# Patient Record
Sex: Female | Born: 1955 | Race: White | Hispanic: No | Marital: Single | Smoking: Current every day smoker
Health system: Southern US, Community
[De-identification: ages and names within clinical notes are randomized; demographics above are authoritative.]

## PROBLEM LIST (undated history)

## (undated) DIAGNOSIS — Z9889 Other specified postprocedural states: Secondary | ICD-10-CM

## (undated) DIAGNOSIS — I499 Cardiac arrhythmia, unspecified: Secondary | ICD-10-CM

## (undated) DIAGNOSIS — R011 Cardiac murmur, unspecified: Secondary | ICD-10-CM

## (undated) DIAGNOSIS — I2510 Atherosclerotic heart disease of native coronary artery without angina pectoris: Secondary | ICD-10-CM

## (undated) DIAGNOSIS — L920 Granuloma annulare: Secondary | ICD-10-CM

## (undated) DIAGNOSIS — J988 Other specified respiratory disorders: Secondary | ICD-10-CM

## (undated) DIAGNOSIS — K635 Polyp of colon: Secondary | ICD-10-CM

## (undated) DIAGNOSIS — I340 Nonrheumatic mitral (valve) insufficiency: Secondary | ICD-10-CM

## (undated) DIAGNOSIS — Z8489 Family history of other specified conditions: Secondary | ICD-10-CM

## (undated) DIAGNOSIS — T8859XA Other complications of anesthesia, initial encounter: Secondary | ICD-10-CM

## (undated) DIAGNOSIS — E079 Disorder of thyroid, unspecified: Secondary | ICD-10-CM

## (undated) DIAGNOSIS — F419 Anxiety disorder, unspecified: Secondary | ICD-10-CM

## (undated) DIAGNOSIS — D649 Anemia, unspecified: Secondary | ICD-10-CM

## (undated) DIAGNOSIS — M797 Fibromyalgia: Secondary | ICD-10-CM

## (undated) DIAGNOSIS — K219 Gastro-esophageal reflux disease without esophagitis: Secondary | ICD-10-CM

## (undated) DIAGNOSIS — Q179 Congenital malformation of ear, unspecified: Secondary | ICD-10-CM

## (undated) DIAGNOSIS — T4145XA Adverse effect of unspecified anesthetic, initial encounter: Secondary | ICD-10-CM

## (undated) DIAGNOSIS — R112 Nausea with vomiting, unspecified: Secondary | ICD-10-CM

## (undated) DIAGNOSIS — R0600 Dyspnea, unspecified: Secondary | ICD-10-CM

## (undated) DIAGNOSIS — I10 Essential (primary) hypertension: Secondary | ICD-10-CM

## (undated) DIAGNOSIS — Z8619 Personal history of other infectious and parasitic diseases: Secondary | ICD-10-CM

## (undated) DIAGNOSIS — Z974 Presence of external hearing-aid: Secondary | ICD-10-CM

## (undated) HISTORY — DX: Granuloma annulare: L92.0

## (undated) HISTORY — PX: ABDOMINAL HYSTERECTOMY: SHX81

## (undated) HISTORY — DX: Cardiac murmur, unspecified: R01.1

## (undated) HISTORY — PX: CAROTID ENDARTERECTOMY: SUR193

## (undated) HISTORY — DX: Presence of external hearing-aid: Z97.4

## (undated) HISTORY — PX: CARPAL TUNNEL RELEASE: SHX101

## (undated) HISTORY — PX: STAPEDES SURGERY: SHX789

## (undated) HISTORY — PX: TRIGGER FINGER RELEASE: SHX641

## (undated) HISTORY — PX: OOPHORECTOMY: SHX86

## (undated) HISTORY — DX: Polyp of colon: K63.5

## (undated) HISTORY — DX: Personal history of other infectious and parasitic diseases: Z86.19

## (undated) HISTORY — DX: Fibromyalgia: M79.7

---
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
## 2009-03-22 ENCOUNTER — Ambulatory Visit: Payer: Self-pay | Admitting: Internal Medicine

## 2010-01-24 ENCOUNTER — Ambulatory Visit: Payer: Self-pay | Admitting: Internal Medicine

## 2010-02-17 ENCOUNTER — Ambulatory Visit: Payer: Self-pay | Admitting: Internal Medicine

## 2010-03-13 ENCOUNTER — Ambulatory Visit: Payer: Self-pay | Admitting: Internal Medicine

## 2010-12-17 ENCOUNTER — Ambulatory Visit: Payer: Self-pay | Admitting: Vascular Surgery

## 2011-01-07 ENCOUNTER — Ambulatory Visit: Payer: Self-pay | Admitting: Internal Medicine

## 2011-01-20 ENCOUNTER — Ambulatory Visit: Payer: Self-pay | Admitting: Internal Medicine

## 2011-02-04 ENCOUNTER — Ambulatory Visit: Payer: Self-pay | Admitting: Vascular Surgery

## 2011-02-11 ENCOUNTER — Inpatient Hospital Stay: Payer: Self-pay | Admitting: Vascular Surgery

## 2011-08-13 ENCOUNTER — Ambulatory Visit: Payer: Self-pay | Admitting: Internal Medicine

## 2011-12-14 ENCOUNTER — Emergency Department: Payer: Self-pay | Admitting: Emergency Medicine

## 2011-12-14 LAB — BASIC METABOLIC PANEL
Anion Gap: 6 — ABNORMAL LOW (ref 7–16)
Calcium, Total: 9.1 mg/dL (ref 8.5–10.1)
Chloride: 103 mmol/L (ref 98–107)
Co2: 30 mmol/L (ref 21–32)
Creatinine: 1.03 mg/dL (ref 0.60–1.30)
EGFR (African American): >60
EGFR (Non-African Amer.): 59 — ABNORMAL LOW
Potassium: 4.5 mmol/L (ref 3.5–5.1)
Sodium: 139 mmol/L (ref 136–145)

## 2011-12-14 LAB — CK TOTAL AND CKMB (NOT AT ARMC)
CK, Total: 168 U/L (ref 21–215)
CK, Total: 142 U/L (ref 21–215)
CK-MB: 3.2 ng/mL (ref 0.5–3.6)
CK-MB: 2.5 ng/mL (ref 0.5–3.6)

## 2011-12-14 LAB — CBC
HGB: 13.9 g/dL (ref 12.0–16.0)
MCH: 31 pg (ref 26.0–34.0)
MCV: 93 fL (ref 80–100)
Platelet: 147 10*3/uL — ABNORMAL LOW (ref 150–440)
RDW: 13.3 % (ref 11.5–14.5)
WBC: 7.1 10*3/uL (ref 3.6–11.0)

## 2012-03-21 ENCOUNTER — Ambulatory Visit: Payer: Self-pay | Admitting: Internal Medicine

## 2012-08-15 ENCOUNTER — Ambulatory Visit: Payer: Self-pay | Admitting: Internal Medicine

## 2012-10-06 ENCOUNTER — Ambulatory Visit: Payer: Self-pay | Admitting: Vascular Surgery

## 2013-08-01 ENCOUNTER — Ambulatory Visit: Payer: Self-pay | Admitting: Internal Medicine

## 2013-08-16 ENCOUNTER — Ambulatory Visit: Payer: Self-pay | Admitting: Physician Assistant

## 2014-08-20 ENCOUNTER — Ambulatory Visit: Payer: Self-pay | Admitting: Physician Assistant

## 2015-09-15 DIAGNOSIS — J988 Other specified respiratory disorders: Secondary | ICD-10-CM

## 2015-09-15 HISTORY — DX: Other specified respiratory disorders: J98.8

## 2015-12-18 ENCOUNTER — Encounter: Payer: Self-pay | Admitting: Emergency Medicine

## 2015-12-18 ENCOUNTER — Emergency Department
Admission: EM | Admit: 2015-12-18 | Discharge: 2015-12-18 | Disposition: A | Discharge status: Home / Self Care | Payer: BLUE CROSS/BLUE SHIELD | Attending: Emergency Medicine | Admitting: Emergency Medicine

## 2015-12-18 DIAGNOSIS — T7840XA Allergy, unspecified, initial encounter: Secondary | ICD-10-CM

## 2015-12-18 DIAGNOSIS — I10 Essential (primary) hypertension: Secondary | ICD-10-CM | POA: Diagnosis not present

## 2015-12-18 DIAGNOSIS — T360X1A Poisoning by penicillins, accidental (unintentional), initial encounter: Secondary | ICD-10-CM | POA: Insufficient documentation

## 2015-12-18 DIAGNOSIS — E079 Disorder of thyroid, unspecified: Secondary | ICD-10-CM | POA: Insufficient documentation

## 2015-12-18 DIAGNOSIS — F17210 Nicotine dependence, cigarettes, uncomplicated: Secondary | ICD-10-CM | POA: Insufficient documentation

## 2015-12-18 DIAGNOSIS — R21 Rash and other nonspecific skin eruption: Secondary | ICD-10-CM | POA: Insufficient documentation

## 2015-12-18 DIAGNOSIS — I2510 Atherosclerotic heart disease of native coronary artery without angina pectoris: Secondary | ICD-10-CM | POA: Diagnosis not present

## 2015-12-18 DIAGNOSIS — Z90710 Acquired absence of both cervix and uterus: Secondary | ICD-10-CM | POA: Diagnosis not present

## 2015-12-18 HISTORY — DX: Essential (primary) hypertension: I10

## 2015-12-18 HISTORY — DX: Gastro-esophageal reflux disease without esophagitis: K21.9

## 2015-12-18 HISTORY — DX: Atherosclerotic heart disease of native coronary artery without angina pectoris: I25.10

## 2015-12-18 HISTORY — DX: Disorder of thyroid, unspecified: E07.9

## 2015-12-18 MED ORDER — RANITIDINE HCL 150 MG PO TABS: 150.0000 mg | ORAL_TABLET | Freq: Two times a day (BID) | ORAL | Status: DC

## 2015-12-18 MED ORDER — HYDROXYZINE PAMOATE 25 MG PO CAPS: 25.0000 mg | ORAL_CAPSULE | Freq: Three times a day (TID) | ORAL | Status: DC | PRN

## 2015-12-18 MED ORDER — PREDNISONE 10 MG PO TABS: 50.0000 mg | ORAL_TABLET | Freq: Every day | ORAL | Status: DC

## 2015-12-18 NOTE — Discharge Instructions (Signed)

## 2015-12-18 NOTE — ED Provider Notes (Signed)
[facility] Emergency Department Provider Note  ____________________________________________  Time seen: Approximately 7:45 AM  I have reviewed the triage vital signs and the nursing notes.   HISTORY  Chief Complaint Rash and Allergic Reaction    HPI Nicole Herman is a 60 y.o. female presents emergency room for evaluation of a rash and a possible allergic reaction. Patient reports started taking amoxicillin approximately one week ago. She describes the rash is diffuse fine red rash to her abdomen and chest hands and arms. In addition having itching. Feels like her throat is swelling. No obvious swelling to the airway. Patient is speaking in complete sentences. His medical history significant for thyroid disease and hypertension. On a new medication recently was amoxicillin.   Past Medical History  Diagnosis Date  . Thyroid disease   . Hypertension   . Coronary artery disease   . GERD (gastroesophageal reflux disease)     There are no active problems to display for this patient.   Past Surgical History  Procedure Laterality Date  . Abdominal hysterectomy    . Trigger finger release    . Carotid endarterectomy      Current Outpatient Rx  Name  Route  Sig  Dispense  Refill  . hydrOXYzine (VISTARIL) 25 MG capsule   Oral   Take 1 capsule (25 mg total) by mouth 3 (three) times daily as needed.   30 capsule   0   . predniSONE (DELTASONE) 10 MG tablet   Oral   Take 5 tablets (50 mg total) by mouth daily with breakfast.   25 tablet   0   . ranitidine (ZANTAC) 150 MG tablet   Oral   Take 1 tablet (150 mg total) by mouth 2 (two) times daily.   14 tablet   1     Allergies Review of patient's allergies indicates no known allergies.  No family history on file.  Social History Social History  Substance Use Topics  . Smoking status: Current Every Day Smoker -- 1.00 packs/day    Types: Cigarettes  . Smokeless tobacco: None  . Alcohol Use:  No    Review of Systems Constitutional: No fever/chills ENT: No sore throat. Denies any difficulty swallowing. Cardiovascular: Denies chest pain. Respiratory: Denies shortness of breath. Denies any difficulty breathing. Gastrointestinal: No abdominal pain.  No nausea, no vomiting.  No diarrhea.  No constipation. Genitourinary: Negative for dysuria. Musculoskeletal: Negative for back pain. Skin: Positive for rash. Neurological: Negative for headaches, focal weakness or numbness.  10-point ROS otherwise negative.  ____________________________________________   PHYSICAL EXAM:  VITAL SIGNS: ED Triage Vitals  Enc Vitals Group     BP 12/18/15 0729 141/96 mmHg     Pulse Rate 12/18/15 0729 86     Resp 12/18/15 0729 18     Temp 12/18/15 0729 97.8 F (36.6 C)     Temp Source 12/18/15 0729 Oral     SpO2 12/18/15 0729 96 %     Weight 12/18/15 0729 139 lb (63.05 kg)     Height 12/18/15 0729 5\' 4"  (1.626 m)     Head Cir --      Peak Flow --      Pain Score --      Pain Loc --      Pain Edu? --      Excl. in [place]? --     Constitutional: Alert and oriented. Well appearing and in no acute distress. Head: Atraumatic. Nose: No congestion/rhinnorhea. Mouth/Throat: Mucous membranes  are moist.  Oropharynx non-erythematous. Neck: No stridor.  Full range of motion Cardiovascular: Normal rate, regular rhythm. Grossly normal heart sounds.  Good peripheral circulation. Respiratory: Normal respiratory effort.  No retractions. Lungs CTAB. Musculoskeletal: No lower extremity tenderness nor edema.  No joint effusions. Neurologic:  Normal speech and language. No gross focal neurologic deficits are appreciated. No gait instability. Skin:  Skin is warm, dry and intact. Fine erythematous rash scattered on the trunk and arms legs and neck. In addition there is some annular lesions noted. These are chronic in nature. Psychiatric: Mood and affect are normal. Speech and behavior are  normal.  ____________________________________________   LABS (all labs ordered are listed, but only abnormal results are displayed)  Labs Reviewed - No data to display ____________________________________________   PROCEDURES  Procedure(s) performed: None  Critical Care performed: No  ____________________________________________   INITIAL IMPRESSION / ASSESSMENT AND PLAN / ED COURSE  Pertinent labs & imaging results that were available during my care of the patient were reviewed by me and considered in my medical decision making (see chart for details).  Acute allergic reaction secondary to amoxicillin intake. Plan is to stop amoxicillin Rx given for Vistaril, Zantac and prednisone. Patient to follow up with PCP or return to the ER with any worsening symptomology. She voices no other emergency medical complaints at this time. ____________________________________________   FINAL CLINICAL IMPRESSION(S) / ED DIAGNOSES  Final diagnoses:  Allergic reaction caused by a drug     This chart was dictated using voice recognition software/Dragon. Despite best efforts to proofread, errors can occur which can change the meaning. Any change was purely unintentional.   Arlyss Repress, PA-C 12/18/15 [phone]

## 2015-12-18 NOTE — ED Notes (Signed)
Pt presents to ED with complaints of rash and possible allergic reaction. Pt states has been taking amoxicillin approximately one week ago. Pt presents with diffuse fine red rash to abdomen, chest, hands and arms. Pt reports itching. Pt states feeling like throat is swelling. No obvious swelling to airway in triage. Pt speaking in complete sentences, NAD noted in triage.

## 2016-04-14 ENCOUNTER — Other Ambulatory Visit: Payer: Self-pay | Admitting: Nurse Practitioner

## 2016-04-14 DIAGNOSIS — G43119 Migraine with aura, intractable, without status migrainosus: Secondary | ICD-10-CM

## 2016-04-14 DIAGNOSIS — I6523 Occlusion and stenosis of bilateral carotid arteries: Secondary | ICD-10-CM

## 2016-04-17 ENCOUNTER — Ambulatory Visit
Admission: RE | Admit: 2016-04-17 | Discharge: 2016-04-17 | Disposition: A | Discharge status: Home / Self Care | Payer: BLUE CROSS/BLUE SHIELD | Source: Ambulatory Visit | Attending: Nurse Practitioner | Admitting: Nurse Practitioner

## 2016-04-17 DIAGNOSIS — I6523 Occlusion and stenosis of bilateral carotid arteries: Secondary | ICD-10-CM | POA: Diagnosis not present

## 2016-04-17 DIAGNOSIS — I7090 Unspecified atherosclerosis: Secondary | ICD-10-CM | POA: Diagnosis not present

## 2016-04-17 DIAGNOSIS — G43119 Migraine with aura, intractable, without status migrainosus: Secondary | ICD-10-CM | POA: Insufficient documentation

## 2016-04-17 DIAGNOSIS — G939 Disorder of brain, unspecified: Secondary | ICD-10-CM | POA: Insufficient documentation

## 2016-06-09 DIAGNOSIS — M5481 Occipital neuralgia: Secondary | ICD-10-CM | POA: Insufficient documentation

## 2016-06-09 DIAGNOSIS — Z720 Tobacco use: Secondary | ICD-10-CM | POA: Insufficient documentation

## 2016-11-20 DIAGNOSIS — L920 Granuloma annulare: Secondary | ICD-10-CM | POA: Insufficient documentation

## 2017-05-13 ENCOUNTER — Encounter (INDEPENDENT_AMBULATORY_CARE_PROVIDER_SITE_OTHER): Payer: 59

## 2017-05-13 ENCOUNTER — Ambulatory Visit (INDEPENDENT_AMBULATORY_CARE_PROVIDER_SITE_OTHER): Payer: Self-pay | Admitting: Vascular Surgery

## 2017-05-21 ENCOUNTER — Other Ambulatory Visit (INDEPENDENT_AMBULATORY_CARE_PROVIDER_SITE_OTHER): Payer: Self-pay | Admitting: Vascular Surgery

## 2017-05-21 DIAGNOSIS — I739 Peripheral vascular disease, unspecified: Principal | ICD-10-CM

## 2017-05-21 DIAGNOSIS — I779 Disorder of arteries and arterioles, unspecified: Secondary | ICD-10-CM

## 2017-05-25 ENCOUNTER — Ambulatory Visit (INDEPENDENT_AMBULATORY_CARE_PROVIDER_SITE_OTHER): Payer: 59

## 2017-05-25 DIAGNOSIS — I739 Peripheral vascular disease, unspecified: Principal | ICD-10-CM

## 2017-05-25 DIAGNOSIS — I779 Disorder of arteries and arterioles, unspecified: Secondary | ICD-10-CM | POA: Diagnosis not present

## 2017-05-25 LAB — VAS US CAROTID
LCCADDIAS: 27 cm/s
LCCAPDIAS: 30 cm/s
LCCAPSYS: 106 cm/s
LEFT ECA DIAS: -21 cm/s
LEFT VERTEBRAL DIAS: 19 cm/s
LICADDIAS: -53 cm/s
LICADSYS: -89 cm/s
LICAPSYS: -77 cm/s
Left CCA dist sys: 89 cm/s
Left ICA prox dias: -29 cm/s
RCCADSYS: -109 cm/s
RCCAPSYS: 97 cm/s
RIGHT CCA MID DIAS: 23 cm/s
RIGHT ECA DIAS: -25 cm/s
RIGHT VERTEBRAL DIAS: 33 cm/s
Right CCA prox dias: 24 cm/s

## 2017-09-01 ENCOUNTER — Other Ambulatory Visit: Payer: Self-pay | Admitting: Nurse Practitioner

## 2017-09-15 DIAGNOSIS — E039 Hypothyroidism, unspecified: Secondary | ICD-10-CM | POA: Insufficient documentation

## 2017-09-15 DIAGNOSIS — R635 Abnormal weight gain: Secondary | ICD-10-CM | POA: Insufficient documentation

## 2017-09-15 DIAGNOSIS — E782 Mixed hyperlipidemia: Secondary | ICD-10-CM

## 2017-09-15 DIAGNOSIS — E785 Hyperlipidemia, unspecified: Secondary | ICD-10-CM | POA: Insufficient documentation

## 2017-09-15 DIAGNOSIS — N390 Urinary tract infection, site not specified: Secondary | ICD-10-CM | POA: Insufficient documentation

## 2017-09-15 DIAGNOSIS — E559 Vitamin D deficiency, unspecified: Secondary | ICD-10-CM | POA: Insufficient documentation

## 2017-09-15 DIAGNOSIS — F411 Generalized anxiety disorder: Secondary | ICD-10-CM | POA: Insufficient documentation

## 2017-09-15 DIAGNOSIS — R0602 Shortness of breath: Secondary | ICD-10-CM | POA: Insufficient documentation

## 2017-09-15 DIAGNOSIS — I10 Essential (primary) hypertension: Secondary | ICD-10-CM | POA: Insufficient documentation

## 2017-09-16 ENCOUNTER — Encounter: Payer: Self-pay | Admitting: Nurse Practitioner

## 2017-09-16 ENCOUNTER — Ambulatory Visit: Payer: No Typology Code available for payment source | Admitting: Nurse Practitioner

## 2017-09-16 VITALS — BP 130/80 | HR 59 | Resp 16 | Ht 64.0 in | Wt 130.0 lb

## 2017-09-16 DIAGNOSIS — E039 Hypothyroidism, unspecified: Secondary | ICD-10-CM | POA: Diagnosis not present

## 2017-09-16 DIAGNOSIS — L920 Granuloma annulare: Secondary | ICD-10-CM

## 2017-09-16 DIAGNOSIS — E559 Vitamin D deficiency, unspecified: Secondary | ICD-10-CM | POA: Diagnosis not present

## 2017-09-16 DIAGNOSIS — Z1231 Encounter for screening mammogram for malignant neoplasm of breast: Secondary | ICD-10-CM | POA: Diagnosis not present

## 2017-09-16 DIAGNOSIS — R944 Abnormal results of kidney function studies: Secondary | ICD-10-CM

## 2017-09-16 DIAGNOSIS — Z1239 Encounter for other screening for malignant neoplasm of breast: Secondary | ICD-10-CM

## 2017-09-16 DIAGNOSIS — I10 Essential (primary) hypertension: Secondary | ICD-10-CM | POA: Diagnosis not present

## 2017-09-16 MED ORDER — ERGOCALCIFEROL 1.25 MG (50000 UT) PO CAPS: 50000.0000 [IU] | ORAL_CAPSULE | ORAL | 5 refills | Status: DC

## 2017-09-16 NOTE — Progress Notes (Signed)
[facility] [place], [place] [address]  Internal MEDICINE  Office Visit Note  Patient Name: Nicole Herman  [id number]  [id number]  Date of Service: 09/16/2017     Complaints/HPI Pt is here for routine follow up.  The patient is here for routine f/u visit. She has no new concernes or complaints. Right hip is still painful, intermittently. Better than last visit.will rest and use voltaren gel which helps the pain.     Current Medication: Outpatient Encounter Medications as of 09/16/2017  Medication Sig  . aspirin 81 MG chewable tablet Chew by mouth.  . diclofenac sodium (VOLTAREN) 1 % GEL Apply 4 g topically daily as needed.  Marland Kitchen estradiol (ESTRACE) 0.5 MG tablet Take by mouth.  . levothyroxine (SYNTHROID, LEVOTHROID) 75 MCG tablet Take by mouth.  . pantoprazole (PROTONIX) 40 MG tablet Take by mouth.  . simvastatin (ZOCOR) 40 MG tablet Take by mouth.  . telmisartan (MICARDIS) 40 MG tablet   . hydrOXYzine (VISTARIL) 25 MG capsule Take 1 capsule (25 mg total) by mouth 3 (three) times daily as needed. (Patient not taking: Reported on 09/16/2017)  . predniSONE (DELTASONE) 10 MG tablet Take 5 tablets (50 mg total) by mouth daily with breakfast. (Patient not taking: Reported on 09/16/2017)  . ranitidine (ZANTAC) 150 MG tablet Take 1 tablet (150 mg total) by mouth 2 (two) times daily. (Patient not taking: Reported on 09/16/2017)   No facility-administered encounter medications on file as of 09/16/2017.     Surgical History: Past Surgical History:  Procedure Laterality Date  . ABDOMINAL HYSTERECTOMY    . CAROTID ENDARTERECTOMY    . TRIGGER FINGER RELEASE      Medical History: Past Medical History:  Diagnosis Date  . Coronary artery disease   . GERD (gastroesophageal reflux disease)   . Hypertension   . Thyroid disease     Family History: No family history on file.  Social History   Socioeconomic History  . Marital status: Single    Spouse name: Not on  file  . Number of children: Not on file  . Years of education: Not on file  . Highest education level: Not on file  Social Needs  . Financial resource strain: Not on file  . Food insecurity - worry: Not on file  . Food insecurity - inability: Not on file  . Transportation needs - medical: Not on file  . Transportation needs - non-medical: Not on file  Occupational History  . Not on file  Tobacco Use  . Smoking status: Current Every Day Smoker    Packs/day: 1.00    Types: Cigarettes  . Smokeless tobacco: Never Used  Substance and Sexual Activity  . Alcohol use: No  . Drug use: No  . Sexual activity: Not on file  Other Topics Concern  . Not on file  Social History Narrative  . Not on file    Today's Vitals   09/16/17 1108  BP: 130/80  Pulse: (!) 59  Resp: 16  SpO2: 98%  Weight: 130 lb (59 kg)  Height: 5\' 4"  (1.626 m)    Review of Systems  Constitutional: Negative for chills and fever.  HENT: Negative for congestion and sinus pain.   Eyes: Negative.   Respiratory: Negative for cough and wheezing.   Cardiovascular: Negative for chest pain and palpitations.  Gastrointestinal: Negative for constipation.  Genitourinary: Negative.   Musculoskeletal: Negative for back pain and myalgias.  Skin: Negative for itching and rash.  Neurological: Negative  for dizziness, weakness and headaches.  Psychiatric/Behavioral: Negative for depression. The patient is not nervous/anxious.   All other systems reviewed and are negative.    Physical Exam  Constitutional: She is oriented to person, place, and time and well-developed, well-nourished, and in no distress.  HENT:  Head: Normocephalic and atraumatic.  Eyes: Pupils are equal, round, and reactive to light.  Neck: Normal range of motion. Neck supple. No thyromegaly present.  Cardiovascular: Normal rate, regular rhythm and normal heart sounds.  Pulmonary/Chest: Effort normal and breath sounds normal. She has no wheezes.   Abdominal: Soft. Bowel sounds are normal. There is no tenderness.  Musculoskeletal: Normal range of motion.  Neurological: She is alert and oriented to person, place, and time.  Skin: Skin is warm and dry.  Psychiatric: Affect normal.  Nursing note and vitals reviewed.    ICD-10-CM   1. Essential hypertension I10   2. 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 Vitamin D deficiency E55.9 ergocalciferol (VITAMIN D2) 50000 units capsule  5. Screening for breast cancer Z12.31 MM DIGITAL SCREENING BILATERAL   1. bp stable. Continue bp meds as prescribed  2. Thyroid panel stable. Continue levothyroxine as prescribed  3. Granuloma annularis - will continue vitamin d weekly. Recommend regular f/u with dermatology 4. Vitamin d deficiency - restart drisdol 50000 iu weekly for next 6 months 5. Mammogram ordered today. 6. Recheck BMP at next visit.   General Counseling: I have discussed the findings of the evaluation and examination with Nicole Herman.  I have also discussed any further diagnostic evaluation that may be needed or ordered today. Nicole Herman verbalizes understanding of the findings of todays visit. We also reviewed her medications today. she has been encouraged to call the office with any questions or concerns that should arise related to todays visit.  This patient was seen by Leretha Pol, FNP- C in Collaboration with Dr Lavera Guise as a part of collaborative care agreement  She should return for health maintenance exam with pap smear in 4 months and sooner if needed   Patient Active Problem List   Diagnosis Date Noted  . SOB (shortness of breath) 09/15/2017  . Mixed hyperlipidemia 09/15/2017  . Urinary tract infection 09/15/2017  . Abnormal weight gain 09/15/2017  . Hypothyroidism 09/15/2017  . Vitamin D deficiency 09/15/2017  . Generalized anxiety disorder 09/15/2017  . Essential hypertension 09/15/2017  .  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 Occipital neuralgia of left side 06/09/2016  . Tobacco abuse 06/09/2016

## 2017-10-01 LAB — LIPID PANEL W/O CHOL/HDL RATIO
CHOLESTEROL TOTAL: 179 mg/dL (ref 100–199)
HDL: 66 mg/dL (ref >39)
LDL CALC: 92 mg/dL (ref 0–99)
TRIGLYCERIDES: 104 mg/dL (ref 0–149)
VLDL Cholesterol Cal: 21 mg/dL (ref 5–40)

## 2017-10-01 LAB — COMPREHENSIVE METABOLIC PANEL
ALT: 16 IU/L (ref 0–32)
AST: 23 IU/L (ref 0–40)
Albumin/Globulin Ratio: 2.3 — ABNORMAL HIGH (ref 1.2–2.2)
Albumin: 4.6 g/dL (ref 3.6–4.8)
Alkaline Phosphatase: 80 IU/L (ref 39–117)
BILIRUBIN TOTAL: 0.7 mg/dL (ref 0.0–1.2)
BUN/Creatinine Ratio: 12 (ref 12–28)
BUN: 13 mg/dL (ref 8–27)
CHLORIDE: 103 mmol/L (ref 96–106)
CO2: 23 mmol/L (ref 20–29)
Calcium: 9.8 mg/dL (ref 8.7–10.3)
Creatinine, Ser: 1.08 mg/dL — ABNORMAL HIGH (ref 0.57–1.00)
GFR calc Af Amer: 64 mL/min/{1.73_m2} (ref >59)
GFR calc non Af Amer: 56 mL/min/{1.73_m2} — ABNORMAL LOW (ref >59)
GLUCOSE: 95 mg/dL (ref 65–99)
Globulin, Total: 2 g/dL (ref 1.5–4.5)
Potassium: 4.2 mmol/L (ref 3.5–5.2)
Sodium: 141 mmol/L (ref 134–144)
Total Protein: 6.6 g/dL (ref 6.0–8.5)

## 2017-10-01 LAB — TSH: TSH: 1.76 u[IU]/mL (ref 0.450–4.500)

## 2017-10-01 LAB — CBC
HEMATOCRIT: 46 % (ref 34.0–46.6)
Hemoglobin: 15.4 g/dL (ref 11.1–15.9)
MCH: 30.9 pg (ref 26.6–33.0)
MCHC: 33.5 g/dL (ref 31.5–35.7)
MCV: 92 fL (ref 79–97)
Platelets: 202 10*3/uL (ref 150–379)
RBC: 4.98 x10E6/uL (ref 3.77–5.28)
RDW: 13.9 % (ref 12.3–15.4)
WBC: 6.4 10*3/uL (ref 3.4–10.8)

## 2017-10-01 LAB — VITAMIN D 25 HYDROXY (VIT D DEFICIENCY, FRACTURES): Vit D, 25-Hydroxy: 30.1 ng/mL (ref 30.0–100.0)

## 2017-10-01 LAB — T4, FREE: FREE T4: 1.68 ng/dL (ref 0.82–1.77)

## 2018-01-27 ENCOUNTER — Other Ambulatory Visit: Payer: Self-pay | Admitting: Nurse Practitioner

## 2018-01-27 ENCOUNTER — Ambulatory Visit: Payer: Self-pay | Admitting: Nurse Practitioner

## 2018-02-06 ENCOUNTER — Other Ambulatory Visit: Payer: Self-pay | Admitting: Internal Medicine

## 2018-03-10 ENCOUNTER — Other Ambulatory Visit: Payer: Self-pay | Admitting: Internal Medicine

## 2018-03-10 DIAGNOSIS — L920 Granuloma annulare: Secondary | ICD-10-CM

## 2018-03-10 DIAGNOSIS — E559 Vitamin D deficiency, unspecified: Secondary | ICD-10-CM

## 2018-03-10 MED ORDER — ERGOCALCIFEROL 1.25 MG (50000 UT) PO CAPS: 50000.0000 [IU] | ORAL_CAPSULE | ORAL | 1 refills | Status: DC

## 2018-03-10 MED ORDER — LEVOTHYROXINE SODIUM 75 MCG PO TABS: ORAL_TABLET | ORAL | 5 refills | Status: DC

## 2018-04-20 ENCOUNTER — Other Ambulatory Visit: Payer: Self-pay

## 2018-04-20 MED ORDER — SIMVASTATIN 40 MG PO TABS: 40.0000 mg | ORAL_TABLET | Freq: Every day | ORAL | 6 refills | Status: DC

## 2018-05-05 ENCOUNTER — Other Ambulatory Visit: Payer: Self-pay | Admitting: Internal Medicine

## 2018-05-06 ENCOUNTER — Other Ambulatory Visit: Payer: Self-pay

## 2018-05-06 MED ORDER — ESTRADIOL 0.5 MG PO TABS: ORAL_TABLET | ORAL | 3 refills | Status: DC

## 2018-05-11 ENCOUNTER — Other Ambulatory Visit: Payer: Self-pay

## 2018-05-11 DIAGNOSIS — L920 Granuloma annulare: Secondary | ICD-10-CM

## 2018-05-11 DIAGNOSIS — E559 Vitamin D deficiency, unspecified: Secondary | ICD-10-CM

## 2018-05-11 MED ORDER — ERGOCALCIFEROL 1.25 MG (50000 UT) PO CAPS: 50000.0000 [IU] | ORAL_CAPSULE | ORAL | 1 refills | Status: DC

## 2018-05-26 ENCOUNTER — Encounter (INDEPENDENT_AMBULATORY_CARE_PROVIDER_SITE_OTHER): Payer: Self-pay | Admitting: Vascular Surgery

## 2018-05-26 ENCOUNTER — Other Ambulatory Visit (INDEPENDENT_AMBULATORY_CARE_PROVIDER_SITE_OTHER): Payer: Self-pay | Admitting: Vascular Surgery

## 2018-05-26 ENCOUNTER — Ambulatory Visit (INDEPENDENT_AMBULATORY_CARE_PROVIDER_SITE_OTHER): Payer: No Typology Code available for payment source

## 2018-05-26 ENCOUNTER — Ambulatory Visit (INDEPENDENT_AMBULATORY_CARE_PROVIDER_SITE_OTHER): Payer: No Typology Code available for payment source | Admitting: Vascular Surgery

## 2018-05-26 VITALS — BP 124/77 | HR 62 | Resp 16 | Ht 64.0 in | Wt 131.0 lb

## 2018-05-26 DIAGNOSIS — I6523 Occlusion and stenosis of bilateral carotid arteries: Secondary | ICD-10-CM | POA: Diagnosis not present

## 2018-05-26 DIAGNOSIS — I679 Cerebrovascular disease, unspecified: Secondary | ICD-10-CM

## 2018-05-26 DIAGNOSIS — E782 Mixed hyperlipidemia: Secondary | ICD-10-CM | POA: Diagnosis not present

## 2018-05-26 DIAGNOSIS — Z720 Tobacco use: Secondary | ICD-10-CM | POA: Diagnosis not present

## 2018-05-30 ENCOUNTER — Encounter (INDEPENDENT_AMBULATORY_CARE_PROVIDER_SITE_OTHER): Payer: Self-pay | Admitting: Vascular Surgery

## 2018-05-30 DIAGNOSIS — I6529 Occlusion and stenosis of unspecified carotid artery: Secondary | ICD-10-CM | POA: Insufficient documentation

## 2018-05-30 DIAGNOSIS — I6523 Occlusion and stenosis of bilateral carotid arteries: Principal | ICD-10-CM

## 2018-05-30 NOTE — Progress Notes (Signed)
Subjective:    Patient ID: Nicole Herman, female    DOB: [birth year], 63 y.o.   MRN: [id number] Chief Complaint  Patient presents with  . Follow-up    82yr carotid    Patient presents for a non-invasive study follow up for carotid stenosis.  The patient was last seen on May 25, 2017.  The patient is status post a left CEA on Jan 25, 2011.  The stenosis has been followed by surveillance duplexes. The patient underwent a bilateral carotid duplex scan which showed increase in stenosis to the bilateral carotid arteries. Duplex is now notable for: Right ICA stenosis (60-79% / 213/77) and Left ICA stenosis (1-39%).  Bilateral vertebral arteries demonstrate antegrade flow.  Right subclavian artery flow is disturbed.  Normal flow hemodynamics were seen in the left subclavian artery.  The patient states that she does experience what seems like intermittent left sided weakness at times.  Denies any visual disturbances.  The patient continues a regimen of ASA and dyslipidemia medication.  Denies any fever, nausea vomiting.  Review of Systems  Constitutional: Negative.   HENT: Negative.   Eyes: Negative.   Respiratory: Negative.   Cardiovascular:       Carotid Stenosis  Gastrointestinal: Negative.   Endocrine: Negative.   Genitourinary: Negative.   Musculoskeletal: Negative.   Skin: Negative.   Allergic/Immunologic: Negative.   Neurological: Negative.   Hematological: Negative.   Psychiatric/Behavioral: Negative.       Objective:   Physical Exam  Constitutional: She is oriented to person, place, and time. She appears well-developed and well-nourished. No distress.  HENT:  Head: Normocephalic and atraumatic.  Right Ear: External ear normal.  Left Ear: External ear normal.  Eyes: Pupils are equal, round, and reactive to light. Conjunctivae and EOM are normal.  Neck: Normal range of motion.  Bilateral carotid bruit noted  Cardiovascular: Normal rate, regular rhythm, normal heart sounds  and intact distal pulses.  Pulses:      Radial pulses are 2+ on the right side, and 2+ on the left side.  Pulmonary/Chest: Effort normal and breath sounds normal.  Musculoskeletal: Normal range of motion. She exhibits no edema.  Neurological: She is alert and oriented to person, place, and time.  Skin: Skin is warm and dry. She is not diaphoretic.  Psychiatric: She has a normal mood and affect. Her behavior is normal. Judgment and thought content normal.  Vitals reviewed.  BP 124/77 (BP Location: Left Arm)   Pulse 62   Resp 16   Ht 5\' 4"  (1.626 m)   Wt 131 lb (59.4 kg)   BMI 22.49 kg/m   Past Medical History:  Diagnosis Date  . Coronary artery disease   . GERD (gastroesophageal reflux disease)   . Hypertension   . Thyroid disease    Social History   Socioeconomic History  . Marital status: Single    Spouse name: Not on file  . Number of children: Not on file  . Years of education: Not on file  . Highest education level: Not on file  Occupational History  . Not on file  Social Needs  . Financial resource strain: Not on file  . Food insecurity:    Worry: Not on file    Inability: Not on file  . Transportation needs:    Medical: Not on file    Non-medical: Not on file  Tobacco Use  . Smoking status: Current Every Day Smoker    Packs/day: 1.00    Types: Cigarettes  .  Smokeless tobacco: Never Used  Substance and Sexual Activity  . Alcohol use: No  . Drug use: No  . Sexual activity: Not on file  Lifestyle  . Physical activity:    Days per week: Not on file    Minutes per session: Not on file  . Stress: Not on file  Relationships  . Social connections:    Talks on phone: Not on file    Gets together: Not on file    Attends religious service: Not on file    Active member of club or organization: Not on file    Attends meetings of clubs or organizations: Not on file    Relationship status: Not on file  . Intimate partner violence:    Fear of current or ex  partner: Not on file    Emotionally abused: Not on file    Physically abused: Not on file    Forced sexual activity: Not on file  Other Topics Concern  . Not on file  Social History Narrative  . Not on file   Past Surgical History:  Procedure Laterality Date  . ABDOMINAL HYSTERECTOMY    . CAROTID ENDARTERECTOMY    . TRIGGER FINGER RELEASE     Family History  Problem Relation Age of Onset  . Hypertension Mother   . Cancer Father   . Learning disabilities Brother   . Heart attack Maternal Grandmother    Allergies  Allergen Reactions  . Penicillins       Assessment & Plan:  Patient presents for a non-invasive study follow up for carotid stenosis.  The patient was last seen on May 25, 2017.  The patient is status post a left CEA on Jan 25, 2011.  The stenosis has been followed by surveillance duplexes. The patient underwent a bilateral carotid duplex scan which showed increase in stenosis to the bilateral carotid arteries. Duplex is now notable for: Right ICA stenosis (60-79% / 213/77) and Left ICA stenosis (1-39%).  Bilateral vertebral arteries demonstrate antegrade flow.  Right subclavian artery flow is disturbed.  Normal flow hemodynamics were seen in the left subclavian artery.  The patient states that she does experience what seems like intermittent left sided weakness at times.  Denies any visual disturbances.  The patient continues a regimen of ASA and dyslipidemia medication.  Denies any fever, nausea vomiting.  1. Bilateral carotid artery stenosis - Stable Patient with an increase in right internal carotid artery stenosis approaching 80% Patient with intermittent left sided weakness Patient with a past medical history of a left carotid endarterectomy in 2012 Recommend a CTA of the neck to assess the patient's anatomy and degree of stenosis.  At that time, after a review of the CTA and the decision to possibly repair can be made I have discussed with the patient at length  the risk factors for and pathogenesis of atherosclerotic disease and encouraged a healthy diet, regular exercise regimen and blood pressure / glucose control.  Patient was instructed to contact our office in the interim with problems such as arm / leg weakness or numbness, speech / swallowing difficulty or temporary monocular blindness. The patient expresses their understanding.  The patient is to call our office after her CTA and make an appointment to discuss the results with the physician. Patient expresses her understanding  - CT Angio Neck W/Cm &/Or Wo/Cm; Future - BUN+Creat; Future  2. Tobacco abuse - Stable We had a discussion for approximately five minutes regarding the absolute need for smoking cessation  due to the deleterious nature of tobacco on the vascular system. We discussed the tobacco use would diminish patency of any intervention, and likely significantly worsen progressio of disease. We discussed multiple agents for quitting including replacement therapy or medications to reduce cravings such as Chantix. The patient voices their understanding of the importance of smoking cessation.  3. Mixed hyperlipidemia - Stable Encouraged good control as its slows the progression of atherosclerotic disease  Current Outpatient Medications on File Prior to Visit  Medication Sig Dispense Refill  . aspirin 81 MG chewable tablet Chew by mouth.    . diclofenac sodium (VOLTAREN) 1 % GEL Apply 4 g topically daily as needed.    . ergocalciferol (VITAMIN D2) 50000 units capsule Take 1 capsule (50,000 Units total) by mouth once a week. 4 capsule 1  . estradiol (ESTRACE) 0.5 MG tablet Take 1/2  Tab  By po daily 30 tablet 3  . levothyroxine (SYNTHROID, LEVOTHROID) 75 MCG tablet Take 1 tab po an empty stomach 30 tablet 5  . pantoprazole (PROTONIX) 40 MG tablet TAKE 1 TABLET BY MOUTH TWICE DAILY 60 tablet 0  . simvastatin (ZOCOR) 40 MG tablet Take 1 tablet (40 mg total) by mouth at bedtime. 30 tablet 6   . telmisartan (MICARDIS) 40 MG tablet     . hydrOXYzine (VISTARIL) 25 MG capsule Take 1 capsule (25 mg total) by mouth 3 (three) times daily as needed. (Patient not taking: Reported on 09/16/2017) 30 capsule 0  . predniSONE (DELTASONE) 10 MG tablet Take 5 tablets (50 mg total) by mouth daily with breakfast. (Patient not taking: Reported on 09/16/2017) 25 tablet 0  . ranitidine (ZANTAC) 150 MG tablet Take 1 tablet (150 mg total) by mouth 2 (two) times daily. (Patient not taking: Reported on 09/16/2017) 14 tablet 1   No current facility-administered medications on file prior to visit.    There are no Patient Instructions on file for this visit. No follow-ups on file.  Theador Jezewski A Sravya Grissom, PA-C

## 2018-06-20 ENCOUNTER — Ambulatory Visit
Admission: RE | Admit: 2018-06-20 | Discharge: 2018-06-20 | Disposition: A | Discharge status: Home / Self Care | Payer: BLUE CROSS/BLUE SHIELD | Source: Ambulatory Visit | Attending: Vascular Surgery | Admitting: Vascular Surgery

## 2018-06-20 DIAGNOSIS — I6523 Occlusion and stenosis of bilateral carotid arteries: Secondary | ICD-10-CM | POA: Diagnosis not present

## 2018-06-20 DIAGNOSIS — M47812 Spondylosis without myelopathy or radiculopathy, cervical region: Secondary | ICD-10-CM | POA: Insufficient documentation

## 2018-06-20 LAB — POCT I-STAT CREATININE: CREATININE: 1 mg/dL (ref 0.44–1.00)

## 2018-06-20 MED ORDER — IOPAMIDOL (ISOVUE-370) INJECTION 76%
75.0000 mL | Freq: Once | INTRAVENOUS | Status: AC | PRN
  Administered 2018-06-20: 75 mL via INTRAVENOUS

## 2018-06-23 ENCOUNTER — Ambulatory Visit (INDEPENDENT_AMBULATORY_CARE_PROVIDER_SITE_OTHER): Payer: No Typology Code available for payment source | Admitting: Vascular Surgery

## 2018-06-30 ENCOUNTER — Encounter (INDEPENDENT_AMBULATORY_CARE_PROVIDER_SITE_OTHER): Payer: Self-pay | Admitting: Vascular Surgery

## 2018-06-30 ENCOUNTER — Ambulatory Visit (INDEPENDENT_AMBULATORY_CARE_PROVIDER_SITE_OTHER): Payer: No Typology Code available for payment source | Admitting: Vascular Surgery

## 2018-06-30 VITALS — BP 137/78 | HR 68 | Resp 17 | Ht 64.0 in | Wt 134.2 lb

## 2018-06-30 DIAGNOSIS — E782 Mixed hyperlipidemia: Secondary | ICD-10-CM

## 2018-06-30 DIAGNOSIS — I10 Essential (primary) hypertension: Secondary | ICD-10-CM | POA: Diagnosis not present

## 2018-06-30 DIAGNOSIS — E039 Hypothyroidism, unspecified: Secondary | ICD-10-CM | POA: Diagnosis not present

## 2018-06-30 DIAGNOSIS — F17210 Nicotine dependence, cigarettes, uncomplicated: Secondary | ICD-10-CM

## 2018-06-30 DIAGNOSIS — I6523 Occlusion and stenosis of bilateral carotid arteries: Secondary | ICD-10-CM

## 2018-07-03 ENCOUNTER — Encounter (INDEPENDENT_AMBULATORY_CARE_PROVIDER_SITE_OTHER): Payer: Self-pay | Admitting: Vascular Surgery

## 2018-07-03 NOTE — Progress Notes (Signed)
MRN : [id number]  Nicole Herman is a 62 y.o. (1956/06/30) female who presents with chief complaint of  Chief Complaint  Patient presents with  . Follow-up    ct results  .  History of Present Illness:   The patient is seen for follow up evaluation of carotid stenosis status post CT angiogram. CT scan was done 06/20/2018. Patient reports that the test went well with no problems or complications.   The patient denies interval amaurosis fugax. There is no recent or interval TIA symptoms or focal motor deficits. There is no prior documented CVA.  The patient is taking enteric-coated aspirin 81 mg daily.  There is no history of migraine headaches. There is no history of seizures.  The patient has a history of coronary artery disease, no recent episodes of angina or shortness of breath. The patient denies PAD or claudication symptoms. There is a history of hyperlipidemia which is being treated with a statin.    CT angiogram is reviewed by me personally and shows 65% stenosis consistent with calcified plaque at the origin of the right internal carotid artery.   Current Meds  Medication Sig  . aspirin 81 MG chewable tablet Chew by mouth.  . diclofenac sodium (VOLTAREN) 1 % GEL Apply 4 g topically daily as needed.  . ergocalciferol (VITAMIN D2) 50000 units capsule Take 1 capsule (50,000 Units total) by mouth once a week.  . estradiol (ESTRACE) 0.5 MG tablet Take 1/2  Tab  By po daily  . levothyroxine (SYNTHROID, LEVOTHROID) 75 MCG tablet Take 1 tab po an empty stomach  . pantoprazole (PROTONIX) 40 MG tablet TAKE 1 TABLET BY MOUTH TWICE DAILY  . predniSONE (DELTASONE) 10 MG tablet Take 5 tablets (50 mg total) by mouth daily with breakfast.  . simvastatin (ZOCOR) 40 MG tablet Take 1 tablet (40 mg total) by mouth at bedtime.  Marland Kitchen telmisartan (MICARDIS) 40 MG tablet     Past Medical History:  Diagnosis Date  . Coronary artery disease   . GERD (gastroesophageal reflux disease)   .  Hypertension   . Thyroid disease     Past Surgical History:  Procedure Laterality Date  . ABDOMINAL HYSTERECTOMY    . CAROTID ENDARTERECTOMY    . TRIGGER FINGER RELEASE      Social History Social History   Tobacco Use  . Smoking status: Current Every Day Smoker    Packs/day: 1.00    Types: Cigarettes  . Smokeless tobacco: Never Used  Substance Use Topics  . Alcohol use: No  . Drug use: No    Family History Family History  Problem Relation Age of Onset  . Hypertension Mother   . Cancer Father   . Learning disabilities Brother   . Heart attack Maternal Grandmother     Allergies  Allergen Reactions  . Penicillins      REVIEW OF SYSTEMS (Negative unless checked)  Constitutional: [] Weight loss  [] Fever  [] Chills Cardiac: [] Chest pain   [] Chest pressure   [] Palpitations   [] Shortness of breath when laying flat   [] Shortness of breath with exertion. Vascular:  [] Pain in legs with walking   [] Pain in legs at rest  [] History of DVT   [] Phlebitis   [] Swelling in legs   [] Varicose veins   [] Non-healing ulcers Pulmonary:   [] Uses home oxygen   [] Productive cough   [] Hemoptysis   [] Wheeze  [] COPD   [] Asthma Neurologic:  [] Dizziness   [] Seizures   [] History of stroke   [x] History  of TIA  [] Aphasia   [] Vissual changes   [] Weakness or numbness in arm   [] Weakness or numbness in leg Musculoskeletal:   [] Joint swelling   [x] Joint pain   [] Low back pain Hematologic:  [] Easy bruising  [] Easy bleeding   [] Hypercoagulable state   [] Anemic Gastrointestinal:  [] Diarrhea   [] Vomiting  [] Gastroesophageal reflux/heartburn   [] Difficulty swallowing. Genitourinary:  [] Chronic kidney disease   [] Difficult urination  [] Frequent urination   [] Blood in urine Skin:  [] Rashes   [] Ulcers  Psychological:  [] History of anxiety   []  History of major depression.  Physical Examination  Vitals:   06/30/18 1330  BP: 137/78  Pulse: 68  Resp: 17  Weight: 134 lb 3.2 oz (60.9 kg)  Height: 5\' 4"  (1.626  m)   Body mass index is 23.04 kg/m. Gen: WD/WN, NAD Head: [place]/AT, No temporalis wasting.  Ear/Nose/Throat: Hearing grossly intact, nares w/o erythema or drainage Eyes: PER, EOMI, sclera nonicteric.  Neck: Supple, no large masses.   Pulmonary:  Good air movement, no audible wheezing bilaterally, no use of accessory muscles.  Cardiac: RRR, no JVD Vascular: right carotid bruit left CEA incisional scar well healed Vessel Right Left  Radial Palpable Palpable  Ulnar Palpable Palpable  Brachial Palpable Palpable  Carotid Palpable Palpable  Gastrointestinal: Non-distended. No guarding/no peritoneal signs.  Musculoskeletal: M/S 5/5 throughout.  No deformity or atrophy.  Neurologic: CN 2-12 intact. Symmetrical.  Speech is fluent. Motor exam as listed above. Psychiatric: Judgment intact, Mood & affect appropriate for pt's clinical situation. Dermatologic: No rashes or ulcers noted.  No changes consistent with cellulitis. Lymph : No lichenification or skin changes of chronic lymphedema.  CBC Lab Results  Component Value Date   WBC 6.4 09/01/2017   HGB 15.4 09/01/2017   HCT 46.0 09/01/2017   MCV 92 09/01/2017   PLT 202 09/01/2017    BMET    Component Value Date/Time   NA 141 09/01/2017 0921   NA 139 12/14/2011 0731   K 4.2 09/01/2017 0921   K 4.5 12/14/2011 0731   CL 103 09/01/2017 0921   CL 103 12/14/2011 0731   CO2 23 09/01/2017 0921   CO2 30 12/14/2011 0731   GLUCOSE 95 09/01/2017 0921   GLUCOSE 126 (H) 12/14/2011 0731   BUN 13 09/01/2017 0921   BUN 14 12/14/2011 0731   CREATININE 1.00 06/20/2018 0847   CREATININE 1.03 12/14/2011 0731   CALCIUM 9.8 09/01/2017 0921   CALCIUM 9.1 12/14/2011 0731   GFRNONAA 56 (L) 09/01/2017 0921   GFRNONAA 59 (L) 12/14/2011 0731   GFRAA 64 09/01/2017 0921   GFRAA >60 12/14/2011 0731   Estimated Creatinine Clearance: 50.4 mL/min (by C-G formula based on SCr of 1 mg/dL).  COAG No results found for: INR, PROTIME  Radiology Ct Angio  Neck W/cm &/or Wo/cm  Result Date: 06/20/2018 CLINICAL DATA:  Right carotid stenosis. Abnormal ultrasound. Previous left carotid endarterectomy. Possible recurrent left internal carotid artery stenosis. EXAM: CT ANGIOGRAPHY NECK TECHNIQUE: Multidetector CT imaging of the neck was performed using the standard protocol during bolus administration of intravenous contrast. Multiplanar CT image reconstructions and MIPs were obtained to evaluate the vascular anatomy. Carotid stenosis measurements (when applicable) are obtained utilizing NASCET criteria, using the distal internal carotid diameter as the denominator. CONTRAST:  48mL ISOVUE-370 IOPAMIDOL (ISOVUE-370) INJECTION 76% COMPARISON:  CTA neck 12/17/2010 FINDINGS: Aortic arch: Progressive atherosclerotic calcifications are present at the aortic arch. There is no aneurysm. No significant stenosis is present at the great vessel origins.  Moderate atherosclerotic irregularity is present in the proximal left subclavian artery without a significant stenosis relative to the more distal vessel. Right carotid system: The right common carotid artery is within normal limits. The lumen is narrowed to 1.3 mm. A lateral punctate ulceration is present within soft tissue plaque. There is mild tortuosity of the cervical right ICA without a tandem stenosis. The more distal vessel measures 3.4 mm. No other significant stenosis is present through the ICA termini. Left carotid system: The left common carotid artery is within normal limits. Endarterectomy is noted. No significant residual recurrent stenosis is present relative to the more distal vessel. There is moderate tortuosity of the cervical left ICA just below the skull base. Atherosclerotic calcifications are present within the cavernous left ICA without a significant stenosis relative to the ICA terminus. Visualized intracranial vessels are within normal limits. Vertebral arteries: The vertebral arteries originate from the  subclavian arteries without significant stenosis. The right vertebral artery is the dominant vessel. The right vertebral artery enters at C4, a normal variant. There is no significant stenosis of either vertebral artery in the neck. The vertebrobasilar junction is normal. PICA origins are below the dural margin. Basilar artery is normal. Both posterior cerebral arteries originate from the basilar tip. Skeleton: Uncovertebral disease is most prominent at C4-5, C5-6, and C6-7. Osseous foraminal narrowing is greatest on the right at C5-6 and on the left at C4-5. Vertebral body heights are maintained. No focal lytic or blastic lesions are present otherwise. Other neck: The soft tissues of the neck are otherwise unremarkable. Thyroid is atrophic. Question left thyroidectomy. No significant adenopathy is present. Salivary glands are within normal limits. No focal mucosal or submucosal lesions are present. Circumferential mucosal disease is present in the right maxillary sinus without fluid level. Mastoid air cells are clear. Upper chest: Centrilobular emphysematous changes are present bilaterally. No focal nodule, mass, or airspace disease is present. The thoracic inlet is within normal limits. Subcentimeter paratracheal nodes are likely inflammatory. IMPRESSION: 1. 60-65% stenosis of the right internal carotid artery with narrowing of the lumen to less than 1.5 mm. 2. Left carotid endarterectomy with some atherosclerotic irregularity but no significant recurrent stenosis relative to the more distal vessels. 3. Atherosclerotic changes within the proximal left subclavian artery without a significant stenosis. 4. Degenerative changes of the cervical spine as described. 5. Atherosclerotic changes within the cavernous internal carotid arteries without a significant stenosis. Electronically Signed   By: San Morelle M.D.   On: 06/20/2018 09:26     Assessment/Plan 1. Bilateral carotid artery  stenosis Recommend:  Given the patient's asymptomatic subcritical stenosis no further invasive testing or surgery at this time.  Duplex ultrasound shows RICA is only about 60-65% and therefore surgery is not indicated at this time as she is asymptomatic left is widely patent s/p CEA.  The CT was reviewed by me personally.  Continue antiplatelet therapy as prescribed Continue management of CAD, HTN and Hyperlipidemia Healthy heart diet,  encouraged exercise at least 4 times per week Follow up in 6 months with duplex ultrasound and physical exam   - VAS US CAROTID; Future  2. Mixed hyperlipidemia Continue statin as ordered and reviewed, no changes at this time   3. Acquired hypothyroidism Continue Synthroid as ordered and reviewed, no changes at this time   4. Essential hypertension Continue antihypertensive medications as already ordered, these medications have been reviewed and there are no changes at this time.     Hortencia Pilar, MD  07/03/2018  12:58 PM

## 2018-07-06 ENCOUNTER — Telehealth: Payer: Self-pay

## 2018-07-06 ENCOUNTER — Other Ambulatory Visit: Payer: Self-pay

## 2018-07-06 MED ORDER — PANTOPRAZOLE SODIUM 40 MG PO TBEC: 40.0000 mg | DELAYED_RELEASE_TABLET | Freq: Two times a day (BID) | ORAL | 0 refills | Status: DC

## 2018-07-06 NOTE — Telephone Encounter (Signed)
lmom pt need appt for refills  

## 2018-07-06 NOTE — Telephone Encounter (Signed)
Lmom pt need appt for further refills

## 2018-07-09 ENCOUNTER — Other Ambulatory Visit: Payer: Self-pay | Admitting: Nurse Practitioner

## 2018-07-09 DIAGNOSIS — L920 Granuloma annulare: Secondary | ICD-10-CM

## 2018-07-09 DIAGNOSIS — E559 Vitamin D deficiency, unspecified: Secondary | ICD-10-CM

## 2018-07-21 ENCOUNTER — Other Ambulatory Visit: Payer: Self-pay | Admitting: Internal Medicine

## 2018-08-16 ENCOUNTER — Encounter: Payer: Self-pay | Admitting: Internal Medicine

## 2018-08-16 ENCOUNTER — Ambulatory Visit (INDEPENDENT_AMBULATORY_CARE_PROVIDER_SITE_OTHER): Payer: No Typology Code available for payment source | Admitting: Internal Medicine

## 2018-08-16 DIAGNOSIS — E782 Mixed hyperlipidemia: Secondary | ICD-10-CM

## 2018-08-16 DIAGNOSIS — Z1239 Encounter for other screening for malignant neoplasm of breast: Secondary | ICD-10-CM

## 2018-08-16 DIAGNOSIS — F411 Generalized anxiety disorder: Secondary | ICD-10-CM

## 2018-08-16 DIAGNOSIS — F17218 Nicotine dependence, cigarettes, with other nicotine-induced disorders: Secondary | ICD-10-CM

## 2018-08-16 DIAGNOSIS — Z124 Encounter for screening for malignant neoplasm of cervix: Secondary | ICD-10-CM

## 2018-08-16 DIAGNOSIS — L920 Granuloma annulare: Secondary | ICD-10-CM

## 2018-08-16 DIAGNOSIS — E559 Vitamin D deficiency, unspecified: Secondary | ICD-10-CM

## 2018-08-16 DIAGNOSIS — I10 Essential (primary) hypertension: Secondary | ICD-10-CM

## 2018-08-16 DIAGNOSIS — Z0001 Encounter for general adult medical examination with abnormal findings: Secondary | ICD-10-CM

## 2018-08-16 DIAGNOSIS — R05 Cough: Secondary | ICD-10-CM

## 2018-08-16 DIAGNOSIS — R059 Cough, unspecified: Secondary | ICD-10-CM

## 2018-08-16 NOTE — Progress Notes (Signed)
[facility] [place], [place] [address]  Internal MEDICINE  Office Visit Note  Patient Name: Nicole Herman  [id number]  [id number]  Date of Service: 08/31/2018  Chief Complaint  Patient presents with  . Annual Exam  . Hypothyroidism  . Hypertension  . Gastroesophageal Reflux  . Quality Metric Gaps    colonoscopy   HPI Pt is here for routine health maintenance examination  Current Medication: Outpatient Encounter Medications as of 08/16/2018  Medication Sig  . aspirin 81 MG chewable tablet Chew by mouth.  . diclofenac sodium (VOLTAREN) 1 % GEL Apply 4 g topically daily as needed.  Marland Kitchen estradiol (ESTRACE) 0.5 MG tablet Take 1/2  Tab  By po daily  . levothyroxine (SYNTHROID, LEVOTHROID) 75 MCG tablet Take 1 tab po an empty stomach  . simvastatin (ZOCOR) 40 MG tablet Take 1 tablet (40 mg total) by mouth at bedtime.  Marland Kitchen telmisartan (MICARDIS) 40 MG tablet TAKE 1 TABLET BY MOUTH EVERY DAY FOR HIGH BLOOD PRESSURE (Patient taking differently: Take 1/2 tab po daily)  . [DISCONTINUED] pantoprazole (PROTONIX) 40 MG tablet Take 1 tablet (40 mg total) by mouth 2 (two) times daily.  . ergocalciferol (VITAMIN D2) 50000 units capsule Take 1 capsule (50,000 Units total) by mouth once a week. (Patient not taking: Reported on 08/16/2018)  . [DISCONTINUED] hydrOXYzine (VISTARIL) 25 MG capsule Take 1 capsule (25 mg total) by mouth 3 (three) times daily as needed. (Patient not taking: Reported on 09/16/2017)  . [DISCONTINUED] predniSONE (DELTASONE) 10 MG tablet Take 5 tablets (50 mg total) by mouth daily with breakfast. (Patient not taking: Reported on 08/16/2018)  . [DISCONTINUED] ranitidine (ZANTAC) 150 MG tablet Take 1 tablet (150 mg total) by mouth 2 (two) times daily. (Patient not taking: Reported on 09/16/2017)  . [DISCONTINUED] telmisartan (MICARDIS) 40 MG tablet    No facility-administered encounter medications on file as of 08/16/2018.     Surgical History: Past Surgical  History:  Procedure Laterality Date  . ABDOMINAL HYSTERECTOMY    . CAROTID ENDARTERECTOMY    . TRIGGER FINGER RELEASE      Medical History: Past Medical History:  Diagnosis Date  . Coronary artery disease   . GERD (gastroesophageal reflux disease)   . Hypertension   . Thyroid disease     Family History: Family History  Problem Relation Age of Onset  . Hypertension Mother   . Cancer Father   . Learning disabilities Brother   . Heart attack Maternal Grandmother    Review of Systems  Constitutional: Negative for chills, diaphoresis and fatigue.  HENT: Negative for ear pain, postnasal drip and sinus pressure.   Eyes: Negative for photophobia, discharge, redness, itching and visual disturbance.  Respiratory: Negative for cough, shortness of breath and wheezing.   Cardiovascular: Negative for chest pain, palpitations and leg swelling.  Gastrointestinal: Negative for abdominal pain, constipation, diarrhea, nausea and vomiting.  Genitourinary: Negative for dysuria and flank pain.  Musculoskeletal: Negative for arthralgias, back pain, gait problem and neck pain.  Skin: Negative for color change.  Allergic/Immunologic: Negative for environmental allergies and food allergies.  Neurological: Negative for dizziness and headaches.  Hematological: Does not bruise/bleed easily.  Psychiatric/Behavioral: Negative for agitation, behavioral problems (depression) and hallucinations.   Vital Signs: BP 140/84   Pulse 77   Resp 16   Ht 5\' 4"  (1.626 m)   Wt 135 lb (61.2 kg)   SpO2 97%   BMI 23.17 kg/m   Physical Exam Constitutional:  General: She is not in acute distress.    Appearance: She is well-developed and well-nourished. She is not diaphoretic.  HENT:     Head: Normocephalic and atraumatic.     Right Ear: No decreased hearing noted. There is impacted cerumen.     Left Ear: No decreased hearing noted. There is impacted cerumen.     Mouth/Throat:     Mouth: Oropharynx is  clear and moist.     Pharynx: No oropharyngeal exudate.  Eyes:     Extraocular Movements: EOM normal.     Pupils: Pupils are equal, round, and reactive to light.  Neck:     Musculoskeletal: Normal range of motion and neck supple.     Thyroid: No thyromegaly.     Vascular: No JVD.     Trachea: No tracheal deviation.  Cardiovascular:     Rate and Rhythm: Normal rate and regular rhythm.     Heart sounds: Normal heart sounds. No murmur. No friction rub. No gallop.   Pulmonary:     Effort: Pulmonary effort is normal. No respiratory distress.     Breath sounds: No wheezing or rales.  Chest:     Chest wall: No tenderness.     Breasts:        Right: Normal. No mass.        Left: Normal. No mass.  Abdominal:     General: Bowel sounds are normal.     Palpations: Abdomen is soft.  Musculoskeletal: Normal range of motion.  Lymphadenopathy:     Cervical: No cervical adenopathy.  Skin:    General: Skin is warm and dry.  Neurological:     Mental Status: She is alert and oriented to person, place, and time.     Cranial Nerves: No cranial nerve deficit.  Psychiatric:        Mood and Affect: Mood and affect normal.        Behavior: Behavior normal.        Thought Content: Thought content normal.        Judgment: Judgment normal.     LABS: Recent Results (from the past 2160 hour(s))  I-STAT creatinine     Status: None   Collection Time: 06/20/18  8:47 AM  Result Value Ref Range   Creatinine, Ser 1.00 0.44 - 1.00 mg/dL  UA/M w/rflx Culture, Routine     Status: None   Collection Time: 08/16/18 11:22 AM  Result Value Ref Range   Specific Gravity, UA 1.006 1.005 - 1.030   pH, UA 7.0 5.0 - 7.5   Color, UA Yellow Yellow   Appearance Ur Clear Clear   Leukocytes, UA Negative Negative   Protein, UA Negative Negative/Trace   Glucose, UA Negative Negative   Ketones, UA Negative Negative   RBC, UA Negative Negative   Bilirubin, UA Negative Negative   Urobilinogen, Ur 0.2 0.2 - 1.0 mg/dL    Nitrite, UA Negative Negative   Microscopic Examination Comment     Comment: Microscopic follows if indicated.   Microscopic Examination See below:     Comment: Microscopic was indicated and was performed.   Urinalysis Reflex Comment     Comment: This specimen has reflexed to a Urine Culture.  Microscopic Examination     Status: Abnormal   Collection Time: 08/16/18 11:22 AM  Result Value Ref Range   WBC, UA 0-5 0 - 5 /hpf   RBC, UA 0-2 0 - 2 /hpf   Epithelial Cells (non renal) 0-10 0 - 10 /  hpf   Casts None seen None seen /lpf   Mucus, UA Present Not Estab.   Bacteria, UA Moderate (A) None seen/Few  Urine Culture, Reflex     Status: Abnormal   Collection Time: 08/16/18 11:22 AM  Result Value Ref Range   Urine Culture, Routine Final report (A)    Organism ID, Bacteria Enterococcus faecalis (A)     Comment: 25,000-50,000 colony forming units per mL   Antimicrobial Susceptibility Comment     Comment:       ** S = Susceptible; I = Intermediate; R = Resistant **                    P = Positive; N = Negative             MICS are expressed in micrograms per mL    Antibiotic                 RSLT#1    RSLT#2    RSLT#3    RSLT#4 Ciprofloxacin                  S Levofloxacin                   S Nitrofurantoin                 S Penicillin                     S Tetracycline                   S Vancomycin                     S   Pap IG and HPV (high risk) DNA detection     Status: None   Collection Time: 08/16/18 11:23 AM  Result Value Ref Range   Interpretation NILM     Comment: NEGATIVE FOR INTRAEPITHELIAL LESION OR MALIGNANCY.   Category NIL     Comment: Negative for Intraepithelial Lesion   Adequacy SDER     Comment: Satisfactory for evaluation.   Clinician Provided ICD10 Comment     Comment: Z12.4   Performed by: Comment     Comment: Lewanda Rife, Cytotechnologist (ASCP)   Note: Comment     Comment: The Pap smear is a screening test designed to aid in the detection  of premalignant and malignant conditions of the uterine cervix.  It is not a diagnostic procedure and should not be used as the sole means of detecting cervical cancer.  Both false-positive and false-negative reports do occur.    Test Methodology Comment     Comment: This liquid based ThinPrep(R) pap test was screened with the use of an image guided system.    HPV, high-risk Negative Negative    Comment: This nucleic acid amplification high-risk HPV test detects thirteen high-risk types (16,18,31,33,35,39,45,51,52,56,58,59,68) without differentiation.    Assessment/Plan: 1. Encounter for general adult medical examination with abnormal findings - UA/M w/rflx Culture, Routine - CBC with Differential/Platelet; Future - Lipid Panel With LDL/HDL Ratio; Future - TSH; Future - T4, free; Future - Comprehensive metabolic panel - VITAMIN D 25 Hydroxy (Vit-D Deficiency, Fractures); Future - DG Bone Density; Future, Pt will come for ear irrigation for debrox   2. Routine cervical smear - Pap IG and HPV (high risk) DNA detection  3. Breast screening - MM 3D SCREEN BREAST BILATERAL; Future  4. Essential hypertension -  Controlled   5. Vitamin D deficiency - Comprehensive metabolic panel - VITAMIN D 25 Hydroxy (Vit-D Deficiency, Fractures); Future  6. Mixed hyperlipidemia - Controlled  7. 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 Cigarette nicotine dependence with other nicotine-induced disorder Smoking cessation counseling: 1. Pt acknowledges the risks of long term smoking, she will try to quite smoking. 2. Options for different medications including nicotine products, chewing gum, patch etc, Wellbutrin and Chantix is discussed 3. Goal and date of compete cessation is discussed 4. Total time spent in smoking cessation is 10 min.  10. Cough - DG Chest 2 View; Future  General Counseling: Jaylani verbalizes  understanding of the findings of todays visit and agrees with plan of treatment. I have discussed any further diagnostic evaluation that may be needed or ordered today. We also reviewed her medications today. she has been encouraged to call the office with any questions or concerns that should arise related to todays visit.   Orders Placed This Encounter  Procedures  . Microscopic Examination  . Urine Culture, Reflex  . DG Bone Density  . DG Chest 2 View  . MM 3D SCREEN BREAST BILATERAL  . UA/M w/rflx Culture, Routine  . CBC with Differential/Platelet  . Lipid Panel With LDL/HDL Ratio  . TSH  . T4, free  . Comprehensive metabolic panel  . VITAMIN D 25 Hydroxy (Vit-D Deficiency, Fractures)    Time spent: [place], MD  Internal Medicine

## 2018-08-17 ENCOUNTER — Ambulatory Visit: Payer: No Typology Code available for payment source

## 2018-08-17 VITALS — BP 140/80 | HR 66 | Resp 16 | Ht 64.0 in | Wt 135.0 lb

## 2018-08-17 DIAGNOSIS — H6121 Impacted cerumen, right ear: Secondary | ICD-10-CM

## 2018-08-17 NOTE — Progress Notes (Signed)
Washed pt right ear, used drops of colace before the second rinse. Some ear wax came out. Pt stated that she was not dizzy and feel ok.

## 2018-08-18 LAB — PAP IG AND HPV HIGH-RISK: HPV, HIGH-RISK: NEGATIVE

## 2018-08-19 LAB — UA/M W/RFLX CULTURE, ROUTINE
Bilirubin, UA: NEGATIVE
Glucose, UA: NEGATIVE
KETONES UA: NEGATIVE
LEUKOCYTES UA: NEGATIVE
Nitrite, UA: NEGATIVE
Protein, UA: NEGATIVE
RBC, UA: NEGATIVE
Specific Gravity, UA: 1.006 (ref 1.005–1.030)
Urobilinogen, Ur: 0.2 mg/dL (ref 0.2–1.0)
pH, UA: 7 (ref 5.0–7.5)

## 2018-08-19 LAB — URINE CULTURE, REFLEX

## 2018-08-19 LAB — MICROSCOPIC EXAMINATION: CASTS: NONE SEEN /LPF

## 2018-08-22 ENCOUNTER — Telehealth: Payer: Self-pay

## 2018-08-22 NOTE — Telephone Encounter (Signed)
Pt advised pap is normal

## 2018-08-24 ENCOUNTER — Other Ambulatory Visit: Payer: Self-pay | Admitting: Internal Medicine

## 2018-09-13 ENCOUNTER — Other Ambulatory Visit: Payer: Self-pay

## 2018-09-13 ENCOUNTER — Telehealth: Payer: Self-pay

## 2018-09-13 MED ORDER — LEVOTHYROXINE SODIUM 75 MCG PO TABS: 75.0000 ug | ORAL_TABLET | Freq: Every day | ORAL | 5 refills | Status: DC

## 2018-09-13 MED ORDER — LEVOTHYROXINE SODIUM 75 MCG PO TABS: ORAL_TABLET | ORAL | 11 refills | Status: DC

## 2018-09-13 NOTE — Telephone Encounter (Signed)
error 

## 2018-09-28 LAB — COMPREHENSIVE METABOLIC PANEL
ALK PHOS: 93 IU/L (ref 39–117)
ALT: 19 IU/L (ref 0–32)
AST: 22 IU/L (ref 0–40)
Albumin/Globulin Ratio: 2.6 — ABNORMAL HIGH (ref 1.2–2.2)
Albumin: 5.1 g/dL — ABNORMAL HIGH (ref 3.6–4.8)
BUN/Creatinine Ratio: 15 (ref 12–28)
BUN: 15 mg/dL (ref 8–27)
Bilirubin Total: 0.7 mg/dL (ref 0.0–1.2)
CO2: 22 mmol/L (ref 20–29)
Calcium: 10.1 mg/dL (ref 8.7–10.3)
Chloride: 100 mmol/L (ref 96–106)
Creatinine, Ser: 1.03 mg/dL — ABNORMAL HIGH (ref 0.57–1.00)
GFR calc Af Amer: 67 mL/min/{1.73_m2} (ref >59)
GFR calc non Af Amer: 58 mL/min/{1.73_m2} — ABNORMAL LOW (ref >59)
Globulin, Total: 2 g/dL (ref 1.5–4.5)
Glucose: 93 mg/dL (ref 65–99)
POTASSIUM: 4.5 mmol/L (ref 3.5–5.2)
Sodium: 139 mmol/L (ref 134–144)
Total Protein: 7.1 g/dL (ref 6.0–8.5)

## 2018-10-06 ENCOUNTER — Ambulatory Visit
Admission: RE | Admit: 2018-10-06 | Discharge: 2018-10-06 | Disposition: A | Discharge status: Home / Self Care | Payer: BLUE CROSS/BLUE SHIELD | Source: Ambulatory Visit | Attending: Internal Medicine | Admitting: Internal Medicine

## 2018-10-06 DIAGNOSIS — Z0001 Encounter for general adult medical examination with abnormal findings: Secondary | ICD-10-CM

## 2018-10-06 DIAGNOSIS — R05 Cough: Secondary | ICD-10-CM | POA: Diagnosis present

## 2018-10-06 DIAGNOSIS — F411 Generalized anxiety disorder: Secondary | ICD-10-CM

## 2018-10-06 DIAGNOSIS — I10 Essential (primary) hypertension: Secondary | ICD-10-CM

## 2018-10-06 DIAGNOSIS — E559 Vitamin D deficiency, unspecified: Secondary | ICD-10-CM | POA: Diagnosis present

## 2018-10-06 DIAGNOSIS — L920 Granuloma annulare: Secondary | ICD-10-CM | POA: Diagnosis present

## 2018-10-06 DIAGNOSIS — Z124 Encounter for screening for malignant neoplasm of cervix: Secondary | ICD-10-CM

## 2018-10-06 DIAGNOSIS — Z1239 Encounter for other screening for malignant neoplasm of breast: Secondary | ICD-10-CM

## 2018-10-06 DIAGNOSIS — E782 Mixed hyperlipidemia: Secondary | ICD-10-CM

## 2018-10-06 DIAGNOSIS — F17218 Nicotine dependence, cigarettes, with other nicotine-induced disorders: Secondary | ICD-10-CM

## 2018-10-06 DIAGNOSIS — R059 Cough, unspecified: Secondary | ICD-10-CM

## 2018-10-25 ENCOUNTER — Other Ambulatory Visit: Payer: Self-pay | Admitting: Internal Medicine

## 2018-10-27 ENCOUNTER — Other Ambulatory Visit: Payer: Self-pay

## 2018-10-27 ENCOUNTER — Telehealth: Payer: Self-pay

## 2018-10-27 MED ORDER — IBANDRONATE SODIUM 150 MG PO TABS: 150.0000 mg | ORAL_TABLET | ORAL | 6 refills | Status: DC

## 2018-10-27 NOTE — Telephone Encounter (Signed)
Pt advised for bone density showed osteoporosis as per dr Humphrey Rolls send boniva 150 mg take 1 tab po every mouth and also take OTC calcium with vitamin D [phone] daily

## 2018-11-01 ENCOUNTER — Other Ambulatory Visit: Payer: Self-pay | Admitting: Adult Health

## 2018-12-05 ENCOUNTER — Other Ambulatory Visit: Payer: Self-pay

## 2018-12-05 ENCOUNTER — Encounter: Payer: Self-pay | Admitting: Adult Health

## 2018-12-05 ENCOUNTER — Ambulatory Visit (INDEPENDENT_AMBULATORY_CARE_PROVIDER_SITE_OTHER): Payer: Self-pay | Admitting: Adult Health

## 2018-12-05 VITALS — BP 142/88 | HR 75 | Temp 98.4°F | Resp 16 | Ht 64.0 in | Wt 134.0 lb

## 2018-12-05 DIAGNOSIS — R05 Cough: Secondary | ICD-10-CM

## 2018-12-05 DIAGNOSIS — R059 Cough, unspecified: Secondary | ICD-10-CM

## 2018-12-05 DIAGNOSIS — F17218 Nicotine dependence, cigarettes, with other nicotine-induced disorders: Secondary | ICD-10-CM

## 2018-12-05 DIAGNOSIS — J0110 Acute frontal sinusitis, unspecified: Secondary | ICD-10-CM

## 2018-12-05 MED ORDER — AZITHROMYCIN 250 MG PO TABS: ORAL_TABLET | ORAL | 0 refills | Status: DC

## 2018-12-05 NOTE — Progress Notes (Signed)
[facility] [place], [place] [address]  Internal MEDICINE  Office Visit Note  Patient Name: Nicole Herman  [id number]  [id number]  Date of Service: 12/05/2018  Chief Complaint  Patient presents with  . Cough    was in [place] and after four days of being there came home started berfore going to [place],      HPI Pt is here for a sick visit. Pt reports a week ago she noticed sore throat, hoarseness, and nasal congestion. She also has dry cough.  Denies fever or chills at this time.  She reports she is able to be up and about, she does have lots of drainage at night.        Current Medication:  Outpatient Encounter Medications as of 12/05/2018  Medication Sig  . aspirin 81 MG chewable tablet Chew by mouth.  . diclofenac sodium (VOLTAREN) 1 % GEL Apply 4 g topically daily as needed.  . ergocalciferol (VITAMIN D2) 50000 units capsule Take 1 capsule (50,000 Units total) by mouth once a week. (Patient not taking: Reported on 08/16/2018)  . estradiol (ESTRACE) 0.5 MG tablet Take 1/2  Tab  By po daily  . ibandronate (BONIVA) 150 MG tablet Take 1 tablet (150 mg total) by mouth every 30 (thirty) days. Take in the morning with a full glass of water, on an empty stomach,  . levothyroxine (SYNTHROID) 75 MCG tablet Take 1 tablet (75 mcg total) by mouth daily before breakfast.  . pantoprazole (PROTONIX) 40 MG tablet TAKE 1 TABLET(40 MG) BY MOUTH TWICE DAILY  . simvastatin (ZOCOR) 40 MG tablet Take 1 tablet (40 mg total) by mouth at bedtime.  Marland Kitchen telmisartan (MICARDIS) 40 MG tablet Take 1/2 tab po daily   No facility-administered encounter medications on file as of 12/05/2018.       Medical History: Past Medical History:  Diagnosis Date  . Coronary artery disease   . GERD (gastroesophageal reflux disease)   . Hypertension   . Thyroid disease      Vital Signs: BP (!) 142/88   Pulse 75   Temp 98.4 F (36.9 C)   Resp 16   Ht 5\' 4"  (1.626 m)   Wt 134 lb (60.8  kg)   SpO2 95%   BMI 23.00 kg/m    Review of Systems  Constitutional: Negative for chills, fatigue and unexpected weight change.  HENT: Positive for postnasal drip, sinus pressure, sinus pain and sore throat. Negative for congestion, rhinorrhea and sneezing.   Eyes: Negative for photophobia, pain and redness.  Respiratory: Positive for cough. Negative for chest tightness and shortness of breath.   Cardiovascular: Negative for chest pain and palpitations.  Gastrointestinal: Negative for abdominal pain, constipation, diarrhea, nausea and vomiting.  Endocrine: Negative.   Genitourinary: Negative for dysuria and frequency.  Musculoskeletal: Negative for arthralgias, back pain, joint swelling and neck pain.  Skin: Negative for rash.  Allergic/Immunologic: Negative.   Neurological: Negative for tremors and numbness.  Hematological: Negative for adenopathy. Does not bruise/bleed easily.  Psychiatric/Behavioral: Negative for behavioral problems and sleep disturbance. The patient is not nervous/anxious.     Physical Exam Vitals signs and nursing note reviewed.  Constitutional:      General: She is not in acute distress.    Appearance: She is well-developed. She is not diaphoretic.  HENT:     Head: Normocephalic and atraumatic.     Mouth/Throat:     Pharynx: No oropharyngeal exudate.  Eyes:     Pupils:  Pupils are equal, round, and reactive to light.  Neck:     Musculoskeletal: Normal range of motion and neck supple.     Thyroid: No thyromegaly.     Vascular: No JVD.     Trachea: No tracheal deviation.  Cardiovascular:     Rate and Rhythm: Normal rate and regular rhythm.     Heart sounds: Normal heart sounds. No murmur. No friction rub. No gallop.   Pulmonary:     Effort: Pulmonary effort is normal. No respiratory distress.     Breath sounds: Normal breath sounds. No wheezing or rales.  Chest:     Chest wall: No tenderness.  Abdominal:     Palpations: Abdomen is soft.      Tenderness: There is no abdominal tenderness. There is no guarding.  Musculoskeletal: Normal range of motion.  Lymphadenopathy:     Cervical: No cervical adenopathy.  Skin:    General: Skin is warm and dry.  Neurological:     Mental Status: She is alert and oriented to person, place, and time.     Cranial Nerves: No cranial nerve deficit.  Psychiatric:        Behavior: Behavior normal.        Thought Content: Thought content normal.        Judgment: Judgment normal.     Assessment/Plan: 1. Acute non-recurrent frontal sinusitis Advised patient to take entire course of antibiotics as prescribed with food. Pt should return to clinic in 7-10 days if symptoms fail to improve or new symptoms develop.  - azithromycin (ZITHROMAX) 250 MG tablet; Take as directed  Dispense: 6 tablet; Refill: 0  2. Cough Mostly at night due to drainage.  If it worsens she will RTC.   3. Cigarette nicotine dependence with other nicotine-induced disorder Smoking cessation counseling: 1. Pt acknowledges the risks of long term smoking, she will try to quite smoking. 2. Options for different medications including nicotine products, chewing gum, patch etc, Wellbutrin and Chantix is discussed 3. Goal and date of compete cessation is discussed 4. Total time spent in smoking cessation is 15 min.   General Counseling: Malea verbalizes understanding of the findings of todays visit and agrees with plan of treatment. I have discussed any further diagnostic evaluation that may be needed or ordered today. We also reviewed her medications today. she has been encouraged to call the office with any questions or concerns that should arise related to todays visit.   No orders of the defined types were placed in this encounter.   No orders of the defined types were placed in this encounter.   Time spent: 25 Minutes  This patient was seen by Orson Gear AGNP-C in Collaboration with Dr Lavera Guise as a part of  collaborative care agreement.  Kendell Bane AGNP-C Internal Medicine

## 2018-12-13 ENCOUNTER — Other Ambulatory Visit: Payer: Self-pay

## 2018-12-13 ENCOUNTER — Telehealth: Payer: Self-pay

## 2018-12-13 MED ORDER — NYSTATIN 100000 UNIT/ML MT SUSP: OROMUCOSAL | 0 refills | Status: DC

## 2018-12-13 NOTE — Telephone Encounter (Signed)
Spoke with she still having dry cough scratching throat as per dr Clayborn Bigness send nystatin suspension and advised  Her to take OTC mucinex for cough if she is not feeling better call us back

## 2018-12-28 ENCOUNTER — Other Ambulatory Visit: Payer: Self-pay

## 2018-12-28 MED ORDER — ESTRADIOL 0.5 MG PO TABS: ORAL_TABLET | ORAL | 3 refills | Status: DC

## 2019-01-02 ENCOUNTER — Ambulatory Visit (INDEPENDENT_AMBULATORY_CARE_PROVIDER_SITE_OTHER): Payer: No Typology Code available for payment source | Admitting: Vascular Surgery

## 2019-01-02 ENCOUNTER — Encounter (INDEPENDENT_AMBULATORY_CARE_PROVIDER_SITE_OTHER): Payer: No Typology Code available for payment source

## 2019-01-03 ENCOUNTER — Other Ambulatory Visit: Payer: Self-pay

## 2019-01-03 ENCOUNTER — Ambulatory Visit: Payer: Self-pay | Admitting: Internal Medicine

## 2019-01-03 ENCOUNTER — Encounter: Payer: Self-pay | Admitting: Internal Medicine

## 2019-01-03 VITALS — Ht 64.0 in | Wt 134.0 lb

## 2019-01-03 DIAGNOSIS — J440 Chronic obstructive pulmonary disease with acute lower respiratory infection: Secondary | ICD-10-CM

## 2019-01-03 DIAGNOSIS — R490 Dysphonia: Secondary | ICD-10-CM

## 2019-01-03 DIAGNOSIS — F411 Generalized anxiety disorder: Secondary | ICD-10-CM

## 2019-01-03 DIAGNOSIS — F17210 Nicotine dependence, cigarettes, uncomplicated: Secondary | ICD-10-CM

## 2019-01-03 DIAGNOSIS — J209 Acute bronchitis, unspecified: Secondary | ICD-10-CM

## 2019-01-03 MED ORDER — ESCITALOPRAM OXALATE 10 MG PO TABS: 10.0000 mg | ORAL_TABLET | Freq: Every day | ORAL | 2 refills | Status: DC

## 2019-01-03 MED ORDER — ALBUTEROL SULFATE HFA 108 (90 BASE) MCG/ACT IN AERS: 2.0000 | INHALATION_SPRAY | Freq: Four times a day (QID) | RESPIRATORY_TRACT | 0 refills | Status: DC | PRN

## 2019-01-03 MED ORDER — ALPRAZOLAM 0.25 MG PO TABS: ORAL_TABLET | ORAL | 1 refills | Status: DC

## 2019-01-03 MED ORDER — AZITHROMYCIN 250 MG PO TABS: ORAL_TABLET | ORAL | 0 refills | Status: DC

## 2019-01-03 MED ORDER — PREDNISONE 10 MG PO TABS: ORAL_TABLET | ORAL | 0 refills | Status: DC

## 2019-01-03 NOTE — Progress Notes (Signed)
[facility] [place], [place] [address]  Internal MEDICINE  Telephone Visit  Patient Name: Nicole Herman  [id number]  [id number]  Date of Service: 01/03/2019  I connected with the patient at  [phone] telephone and verified the patients identity using two identifiers.   I discussed the limitations, risks, security and privacy concerns of performing an evaluation and management service by telephone and the availability of in person appointments. I also discussed with the patient that there may be a patient responsible charge related to the service.  The patient expressed understanding and agrees to proceed.    Chief Complaint  Patient presents with  . Telephone Assessment  . Telephone Screen  . Sinusitis  . Laryngitis  . Cough  . Quality Metric Gaps    Colonoscopy     HPI Pt is c/o hoarseness and dry cough. She is also having metallic taste in her mouth. She did finish her azithromycin and Nystatin swish and swallow, cough is a nag and is mostly dry She lives with boyfriend who is recovering alcoholic and it has been very stressful for her. She has been very anxious lately due to COVID-19 pandemic. She has slight pain in her throat as well, she is a smoker and is worried might have a nodule on her throat. Slight sob, no fever or chills    Current Medication: Outpatient Encounter Medications as of 01/03/2019  Medication Sig  . aspirin 81 MG chewable tablet Chew by mouth.  . cholecalciferol (VITAMIN D3) 25 MCG (1000 UT) tablet Take 1,000 Units by mouth daily.  . diclofenac sodium (VOLTAREN) 1 % GEL Apply 4 g topically daily as needed.  Marland Kitchen estradiol (ESTRACE) 0.5 MG tablet Take 1/2  Tab  By po daily  . ibandronate (BONIVA) 150 MG tablet Take 1 tablet (150 mg total) by mouth every 30 (thirty) days. Take in the morning with a full glass of water, on an empty stomach,  . levothyroxine (SYNTHROID) 75 MCG tablet Take 1 tablet (75 mcg total) by mouth daily before  breakfast.  . nystatin (MYCOSTATIN) 100000 UNIT/ML suspension 5 ml swish and swallow  3 times a day for 5 to 7 days  . pantoprazole (PROTONIX) 40 MG tablet TAKE 1 TABLET(40 MG) BY MOUTH TWICE DAILY  . simvastatin (ZOCOR) 40 MG tablet Take 1 tablet (40 mg total) by mouth at bedtime.  Marland Kitchen telmisartan (MICARDIS) 40 MG tablet Take 1/2 tab po daily  . albuterol (VENTOLIN HFA) 108 (90 Base) MCG/ACT inhaler Inhale 2 puffs into the lungs every 6 (six) hours as needed for wheezing or shortness of breath. AND COUGH  . ALPRAZolam (XANAX) 0.25 MG tablet TAKE HALF TO ONE TAB AS NEED FOR ANXIETY AND SHAKES  . azithromycin (ZITHROMAX) 250 MG tablet USE AS DIRECTED FOR URI  . escitalopram (LEXAPRO) 10 MG tablet Take 1 tablet (10 mg total) by mouth daily. FOR GAD  . [DISCONTINUED] azithromycin (ZITHROMAX) 250 MG tablet Take as directed (Patient not taking: Reported on 01/03/2019)  . [DISCONTINUED] predniSONE (DELTASONE) 10 MG tablet TAKE ONE TAB 3 X DAY FOR 2 DAYS THEN TAKE ONE TAB, 2 X DAY FOR 2 DAYS AND THEN TAKE ONE TAB A DAY FOR 2 DAYS FOR COUGH  . [DISCONTINUED] predniSONE (DELTASONE) 10 MG tablet TAKE ONE TAB 3 X DAY FOR 2 DAYS THEN TAKE ONE TAB, 2 X DAY FOR 2 DAYS AND THEN TAKE ONE TAB A DAY FOR 2 DAYS FOR COUGH   No facility-administered encounter medications on  file as of 01/03/2019.     Surgical History: Past Surgical History:  Procedure Laterality Date  . ABDOMINAL HYSTERECTOMY    . CAROTID ENDARTERECTOMY    . TRIGGER FINGER RELEASE      Medical History: Past Medical History:  Diagnosis Date  . Coronary artery disease   . GERD (gastroesophageal reflux disease)   . Hypertension   . Thyroid disease     Family History: Family History  Problem Relation Age of Onset  . Hypertension Mother   . Cancer Father   . Learning disabilities Brother   . Heart attack Maternal Grandmother   . Breast cancer Neg Hx     Social History   Socioeconomic History  . Marital status: Single    Spouse  name: Not on file  . Number of children: Not on file  . Years of education: Not on file  . Highest education level: Not on file  Occupational History  . Not on file  Social Needs  . Financial resource strain: Not on file  . Food insecurity:    Worry: Not on file    Inability: Not on file  . Transportation needs:    Medical: Not on file    Non-medical: Not on file  Tobacco Use  . Smoking status: Current Every Day Smoker    Packs/day: 1.00    Types: Cigarettes  . Smokeless tobacco: Never Used  Substance and Sexual Activity  . Alcohol use: No  . Drug use: No  . Sexual activity: Not on file  Lifestyle  . Physical activity:    Days per week: Not on file    Minutes per session: Not on file  . Stress: Not on file  Relationships  . Social connections:    Talks on phone: Not on file    Gets together: Not on file    Attends religious service: Not on file    Active member of club or organization: Not on file    Attends meetings of clubs or organizations: Not on file    Relationship status: Not on file  . Intimate partner violence:    Fear of current or ex partner: Not on file    Emotionally abused: Not on file    Physically abused: Not on file    Forced sexual activity: Not on file  Other Topics Concern  . Not on file  Social History Narrative  . Not on file    Review of Systems  Constitutional: Positive for fever.  HENT: Positive for postnasal drip and voice change. Negative for facial swelling.   Respiratory: Positive for cough and shortness of breath.   Cardiovascular: Negative.   Gastrointestinal: Positive for diarrhea (but improved ).  Genitourinary: Negative.   Musculoskeletal: Negative.   Psychiatric/Behavioral: Positive for agitation. The patient is nervous/anxious.     Vital Signs: Ht 5\' 4"  (1.626 m)   Wt 134 lb (60.8 kg)   BMI 23.00 kg/m   Observation/Objective: Pt is seen through webcam, NAD, She is heavy voice with some hoarseness and cough seems to  be dry, did not notice any wheezing. She seemed anxious   Assessment/Plan: 1. Acute bronchitis with COPD ([place]) -  Start quick prednisone taper, add azithromycin (ZITHROMAX) 250 MG tablet; USE AS DIRECTED FOR  - albuterol (VENTOLIN HFA) 108 (90 Base) MCG/ACT inhaler; Inhale 2 puffs into the lungs every 6 (six) hours as needed for wheezing or shortness of breath. AND COUGH  Dispense: 1 Inhaler; Refill: 0 - Might need CT  of the chest, since smoker and high risk   2. Hoarse voice quality - Continue with Nystatin swish and swallow, pt might CT of the neck    3. Generalized anxiety disorder -  Start escitalopram (LEXAPRO) 10 MG tablet; Take 1 tablet (10 mg total) by mouth daily. FOR GAD  Dispense: 30 tablet; Refill: 2 - ALPRAZolam (XANAX) 0.25 MG tablet; TAKE HALF TO ONE TAB AS NEED FOR ANXIETY AND SHAKES  Dispense: 20 tablet; Refill: 1  General Counseling: Meghann verbalizes understanding of the findings of today's phone visit and agrees with plan of treatment. I have discussed any further diagnostic evaluation that may be needed or ordered today. We also reviewed her medications today. she has been encouraged to call the office with any questions or concerns that should arise related to todays visit.   Meds ordered this encounter  Medications  . azithromycin (ZITHROMAX) 250 MG tablet    Sig: USE AS DIRECTED FOR URI    Dispense:  6 tablet    Refill:  0  . DISCONTD: predniSONE (DELTASONE) 10 MG tablet    Sig: TAKE ONE TAB 3 X DAY FOR 2 DAYS THEN TAKE ONE TAB, 2 X DAY FOR 2 DAYS AND THEN TAKE ONE TAB A DAY FOR 2 DAYS FOR COUGH    Dispense:  12 tablet    Refill:  0  . escitalopram (LEXAPRO) 10 MG tablet    Sig: Take 1 tablet (10 mg total) by mouth daily. FOR GAD    Dispense:  30 tablet    Refill:  2  . ALPRAZolam (XANAX) 0.25 MG tablet    Sig: TAKE HALF TO ONE TAB AS NEED FOR ANXIETY AND SHAKES    Dispense:  20 tablet    Refill:  1  . albuterol (VENTOLIN HFA) 108 (90 Base) MCG/ACT  inhaler    Sig: Inhale 2 puffs into the lungs every 6 (six) hours as needed for wheezing or shortness of breath. AND COUGH    Dispense:  1 Inhaler    Refill:  0  . DISCONTD: predniSONE (DELTASONE) 10 MG tablet    Sig: TAKE ONE TAB 3 X DAY FOR 2 DAYS THEN TAKE ONE TAB, 2 X DAY FOR 2 DAYS AND THEN TAKE ONE TAB A DAY FOR 2 DAYS FOR COUGH    Dispense:  12 tablet    Refill:  0  Smoking cessation counseling: 1. Pt acknowledges the risks of long term smoking, she will try to quite smoking. 2. Options for different medications including nicotine products, chewing gum, patch etc, Wellbutrin and Chantix is discussed 3. Goal and date of compete cessation is discussed 4. Total time spent in smoking cessation is 10 min.   Time spent:18 Minutes  Dr Lavera Guise Internal medicine

## 2019-01-06 ENCOUNTER — Other Ambulatory Visit: Payer: Self-pay

## 2019-01-06 ENCOUNTER — Telehealth: Payer: Self-pay

## 2019-01-06 MED ORDER — FLUCONAZOLE 150 MG PO TABS: ORAL_TABLET | ORAL | 0 refills | Status: DC

## 2019-01-06 MED ORDER — PROMETHAZINE HCL 12.5 MG PO TABS: ORAL_TABLET | ORAL | 0 refills | Status: DC

## 2019-01-06 NOTE — Telephone Encounter (Signed)
Pt called that was still not feeling good nausea,thrush,light headed and prednisone making her worse as per dr Humphrey Rolls advised pt to stopped prednisone and nystatin and take promethazine for nausea and diflucan if worse she need to go to ED otherwise all [place] back monday

## 2019-01-08 ENCOUNTER — Emergency Department: Payer: BLUE CROSS/BLUE SHIELD

## 2019-01-08 ENCOUNTER — Encounter: Payer: Self-pay | Admitting: Emergency Medicine

## 2019-01-08 ENCOUNTER — Other Ambulatory Visit: Payer: Self-pay

## 2019-01-08 ENCOUNTER — Emergency Department
Admission: EM | Admit: 2019-01-08 | Discharge: 2019-01-08 | Disposition: A | Discharge status: Home / Self Care | Payer: BLUE CROSS/BLUE SHIELD | Attending: Emergency Medicine | Admitting: Emergency Medicine

## 2019-01-08 DIAGNOSIS — Z20828 Contact with and (suspected) exposure to other viral communicable diseases: Secondary | ICD-10-CM | POA: Insufficient documentation

## 2019-01-08 DIAGNOSIS — J040 Acute laryngitis: Secondary | ICD-10-CM | POA: Diagnosis not present

## 2019-01-08 DIAGNOSIS — F17210 Nicotine dependence, cigarettes, uncomplicated: Secondary | ICD-10-CM | POA: Diagnosis not present

## 2019-01-08 DIAGNOSIS — Z79899 Other long term (current) drug therapy: Secondary | ICD-10-CM | POA: Diagnosis not present

## 2019-01-08 DIAGNOSIS — J449 Chronic obstructive pulmonary disease, unspecified: Secondary | ICD-10-CM | POA: Insufficient documentation

## 2019-01-08 DIAGNOSIS — J029 Acute pharyngitis, unspecified: Secondary | ICD-10-CM | POA: Diagnosis present

## 2019-01-08 MED ORDER — ONDANSETRON 4 MG PO TBDP: 4.0000 mg | ORAL_TABLET | Freq: Three times a day (TID) | ORAL | 0 refills | Status: DC | PRN

## 2019-01-08 MED ORDER — PREDNISONE 20 MG PO TABS: 60.0000 mg | ORAL_TABLET | Freq: Every day | ORAL | 0 refills | Status: AC

## 2019-01-08 MED ORDER — PREDNISONE 20 MG PO TABS
60.0000 mg | ORAL_TABLET | Freq: Once | ORAL | Status: AC
Start: 2019-01-08 — End: 2019-01-08
  Administered 2019-01-08: 10:00:00 60 mg via ORAL
  Filled 2019-01-08: qty 3

## 2019-01-08 NOTE — ED Provider Notes (Signed)
[facility] Emergency Department Provider Note  ____________________________________________  Time seen: Approximately 10:28 AM  I have reviewed the triage vital signs and the nursing notes.   HISTORY  Chief Complaint Sore Throat and Cough   HPI Nicole Herman is a 63 y.o. female with history of smoking, COPD, hypertension, hypothyroidism who presents for evaluation of hoarseness.  Patient reports 6 weeks of dry cough and congestion.  A week of hoarseness.  No difficulty swallowing. Has been seen by her primary care doctor 3 times.  Has finished a course of Z-Pak, has been placed on nystatin, prednisone for only 2 days with no significant relief.  She has had mild shortness of breath mostly with exertion.  She continues to smoke.  She denies unintentional weight loss, personal or family history of head and neck cancer.  She denies chest pain.  Patient reports traveling to the [place] right after the onset of her cough.  No known exposures to COVID.  No fever or chills.  Past Medical History:  Diagnosis Date  . Coronary artery disease   . GERD (gastroesophageal reflux disease)   . Hypertension   . Thyroid disease     Patient Active Problem List   Diagnosis Date Noted  . Bilateral carotid artery stenosis 05/30/2018  . SOB (shortness of breath) 09/15/2017  . Mixed hyperlipidemia 09/15/2017  . Urinary tract infection 09/15/2017  . Abnormal weight gain 09/15/2017  . Hypothyroidism 09/15/2017  . Vitamin D deficiency 09/15/2017  . Generalized anxiety disorder 09/15/2017  . Essential hypertension 09/15/2017  . 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 Occipital neuralgia of left side 06/09/2016  . Tobacco abuse 06/09/2016    Past Surgical History:  Procedure Laterality Date  . ABDOMINAL HYSTERECTOMY    . CAROTID ENDARTERECTOMY    . TRIGGER FINGER RELEASE      Prior to Admission medications   Medication Sig Start Date End Date Taking? Authorizing Provider   albuterol (VENTOLIN HFA) 108 (90 Base) MCG/ACT inhaler Inhale 2 puffs into the lungs every 6 (six) hours as needed for wheezing or shortness of breath. AND COUGH 01/03/19   Lavera Guise, MD  ALPRAZolam Duanne Moron) 0.25 MG tablet TAKE HALF TO ONE TAB AS NEED FOR ANXIETY AND SHAKES 01/03/19   Lavera Guise, MD  aspirin 81 MG chewable tablet Chew by mouth.    [provider]  azithromycin (ZITHROMAX) 250 MG tablet USE AS DIRECTED FOR URI 01/03/19   Lavera Guise, MD  cholecalciferol (VITAMIN D3) 25 MCG (1000 UT) tablet Take 1,000 Units by mouth daily.    [provider]  diclofenac sodium (VOLTAREN) 1 % GEL Apply 4 g topically daily as needed.    [provider]  escitalopram (LEXAPRO) 10 MG tablet Take 1 tablet (10 mg total) by mouth daily. FOR GAD 01/03/19   Lavera Guise, MD  estradiol (ESTRACE) 0.5 MG tablet Take 1/2  Tab  By po daily 12/28/18   Lavera Guise, MD  fluconazole (DIFLUCAN) 150 MG tablet Take 1 tab po once and repeat if symptoms persisits 01/06/19   Lavera Guise, MD  ibandronate (BONIVA) 150 MG tablet Take 1 tablet (150 mg total) by mouth every 30 (thirty) days. Take in the morning with a full glass of water, on an empty stomach, 10/27/18   Lavera Guise, MD  levothyroxine (SYNTHROID) 75 MCG tablet Take 1 tablet (75 mcg total) by mouth daily before breakfast. 09/13/18   Lavera Guise, MD  nystatin (  MYCOSTATIN) 100000 UNIT/ML suspension 5 ml swish and swallow  3 times a day for 5 to 7 days 12/13/18   Lavera Guise, MD  pantoprazole (PROTONIX) 40 MG tablet TAKE 1 TABLET(40 MG) BY MOUTH TWICE DAILY 11/01/18   Lavera Guise, MD  predniSONE (DELTASONE) 20 MG tablet Take 3 tablets (60 mg total) by mouth daily for 4 days. 01/08/19 01/12/19  Rudene Re, MD  promethazine (PHENERGAN) 12.5 MG tablet Take 1 tab po TID prn for nausea 01/06/19   Lavera Guise, MD  simvastatin (ZOCOR) 40 MG tablet Take 1 tablet (40 mg total) by mouth at bedtime. 04/20/18   Ronnell Freshwater, NP   telmisartan (MICARDIS) 40 MG tablet Take 1/2 tab po daily 10/26/18   Lavera Guise, MD    Allergies Penicillins  Family History  Problem Relation Age of Onset  . Hypertension Mother   . Cancer Father   . Learning disabilities Brother   . Heart attack Maternal Grandmother   . Breast cancer Neg Hx     Social History Social History   Tobacco Use  . Smoking status: Current Every Day Smoker    Packs/day: 1.00    Types: Cigarettes  . Smokeless tobacco: Never Used  Substance Use Topics  . Alcohol use: No  . Drug use: No    Review of Systems  Constitutional: Negative for fever. Eyes: Negative for visual changes. ENT: Negative for sore throat. + hoarseness, congestion Neck: No neck pain  Cardiovascular: Negative for chest pain. Respiratory: + shortness of breath, cough Gastrointestinal: Negative for abdominal pain, vomiting or diarrhea. Genitourinary: Negative for dysuria. Musculoskeletal: Negative for back pain. Skin: Negative for rash. Neurological: Negative for headaches, weakness or numbness. Psych: No SI or HI  ____________________________________________   PHYSICAL EXAM:  VITAL SIGNS: ED Triage Vitals [01/08/19 0933]  Enc Vitals Group     BP (!) 198/82     Pulse Rate 72     Resp 18     Temp 98.1 F (36.7 C)     Temp Source Oral     SpO2 100 %     Weight 134 lb (60.8 kg)     Height 5\' 4"  (1.626 m)     Head Circumference      Peak Flow      Pain Score 9     Pain Loc      Pain Edu?      Excl. in [place]?     Constitutional: Alert and oriented. Well appearing and in no apparent distress. HEENT:      Head: Normocephalic and atraumatic.         Eyes: Conjunctivae are normal. Sclera is non-icteric.       Mouth/Throat: Mucous membranes are moist.  Oropharynx is clear, no erythema, no exudates, no tonsillar hypertrophy, no peritonsillar abscess, no stridor, airways patent.      Neck: Supple with no signs of meningismus. Cardiovascular: Regular rate and rhythm.  No murmurs, gallops, or rubs. 2+ symmetrical distal pulses are present in all extremities. No JVD. Respiratory: Normal respiratory effort. Lungs are clear to auscultation bilaterally. No wheezes, crackles, or rhonchi.  Gastrointestinal: Soft, non tender, and non distended with positive bowel sounds. No rebound or guarding. Musculoskeletal: Nontender with normal range of motion in all extremities. No edema, cyanosis, or erythema of extremities. Neurologic: Normal speech and language. Face is symmetric. Moving all extremities. No gross focal neurologic deficits are appreciated. Skin: Skin is warm, dry and intact. No rash noted.  Psychiatric: Mood and affect are normal. Speech and behavior are normal.  ____________________________________________   LABS (all labs ordered are listed, but only abnormal results are displayed)  Labs Reviewed  NOVEL CORONAVIRUS, NAA (HOSPITAL ORDER, SEND-OUT TO REF LAB)   ____________________________________________  EKG  none  ____________________________________________  RADIOLOGY  I have personally reviewed the images performed during this visit and I agree with the Radiologist's read.   Interpretation by Radiologist:  Dg Neck Soft Tissue  Result Date: 01/08/2019 CLINICAL DATA:  Sore throat with dry nonproductive cough for 2 weeks. EXAM: NECK SOFT TISSUES - 1+ VIEW COMPARISON:  Neck CTA 06/20/2018. FINDINGS: The prevertebral soft tissues are normal. There is no swelling of the epiglottis. No evidence of foreign body or airway compromise. There is multilevel cervical spondylosis, similar to prior radiographs. Aortic and carotid atherosclerosis noted. IMPRESSION: Normal appearance of the neck soft tissues.  Cervical spondylosis. Electronically Signed   By: Richardean Sale M.D.   On: 01/08/2019 11:12      ____________________________________________   PROCEDURES  Procedure(s) performed: None Procedures Critical Care performed:  None  ____________________________________________   INITIAL IMPRESSION / ASSESSMENT AND PLAN / ED COURSE  63 y.o. female with history of smoking, COPD, hypertension, hypothyroidism who presents for evaluation of cough and congestion x 6 weeks and now 1 week of hoarseness.  Patient is extremely well-appearing, in no distress, normal work of breathing, normal sats, oropharynx is clear with no swelling, no peritonsillar abscess, no stridor, neck is soft with no masses palpable.  XR of the neck WNL. No evidence of Ludwig's angina, bacterial tracheitis, epiglottitis. Will place patient on prednisone. Will swab for Covid due to recent international travel. Recommended close f/u with ENT for scoping if symptoms not improving to rule out mass/ malignancy. Ddx viral laryngitis vs malignancy. Will dc home on prednisone.        As part of my medical decision making, I reviewed the following data within the [facility] notes reviewed and incorporated, Labs reviewed , Old chart reviewed, Radiograph reviewed , Notes from prior ED visits and [place] Controlled Substance Database    Pertinent labs & imaging results that were available during my care of the patient were reviewed by me and considered in my medical decision making (see chart for details).    ____________________________________________   FINAL CLINICAL IMPRESSION(S) / ED DIAGNOSES  Final diagnoses:  Acute laryngitis      NEW MEDICATIONS STARTED DURING THIS VISIT:  ED Discharge Orders         Ordered    predniSONE (DELTASONE) 20 MG tablet  Daily     01/08/19 1117           Note:  This document was prepared using Dragon voice recognition software and may include unintentional dictation errors.    Alfred Levins, [place], MD 01/08/19 [phone]

## 2019-01-08 NOTE — ED Triage Notes (Signed)
Pt presents to ED via POV with c/o sore throat and dry, non-productive cough x several weeks, has been seen by PCP and ENT and dx with allergies and placed on abx and nebs without relief. Pt c/o feeling like "splinters" in her throat and laryngitis. Pt with noted hoarse quality to voice when speaking. Pt also reports being out of country last month.

## 2019-01-09 LAB — NOVEL CORONAVIRUS, NAA (HOSP ORDER, SEND-OUT TO REF LAB; TAT 18-24 HRS): SARS-CoV-2, NAA: NOT DETECTED

## 2019-01-11 ENCOUNTER — Telehealth: Payer: Self-pay | Admitting: Emergency Medicine

## 2019-01-11 NOTE — Telephone Encounter (Signed)
Called patient and informed her of negative covid 19 test.

## 2019-01-13 ENCOUNTER — Telehealth: Payer: Self-pay

## 2019-01-13 ENCOUNTER — Telehealth: Payer: Self-pay | Admitting: Internal Medicine

## 2019-01-13 DIAGNOSIS — J040 Acute laryngitis: Secondary | ICD-10-CM

## 2019-01-13 NOTE — Telephone Encounter (Signed)
Spoke with pt she went to ED all test came back negative they want her she her ENT as per dr Humphrey Rolls we put referral order in Epic for dr Tami Ribas

## 2019-01-13 NOTE — Telephone Encounter (Signed)
Patient was seen in ED needs to be seen by ENT ,ok by Dr Humphrey Rolls

## 2019-01-20 ENCOUNTER — Other Ambulatory Visit: Payer: Self-pay | Admitting: Otolaryngology

## 2019-01-20 DIAGNOSIS — R220 Localized swelling, mass and lump, head: Secondary | ICD-10-CM

## 2019-01-20 DIAGNOSIS — R1310 Dysphagia, unspecified: Secondary | ICD-10-CM

## 2019-01-26 ENCOUNTER — Ambulatory Visit
Admission: RE | Admit: 2019-01-26 | Discharge: 2019-01-26 | Disposition: A | Discharge status: Home / Self Care | Payer: BLUE CROSS/BLUE SHIELD | Source: Ambulatory Visit | Attending: Otolaryngology | Admitting: Otolaryngology

## 2019-01-26 ENCOUNTER — Other Ambulatory Visit: Payer: Self-pay

## 2019-01-26 DIAGNOSIS — R1310 Dysphagia, unspecified: Secondary | ICD-10-CM | POA: Insufficient documentation

## 2019-01-26 DIAGNOSIS — R220 Localized swelling, mass and lump, head: Secondary | ICD-10-CM | POA: Diagnosis present

## 2019-01-26 LAB — POCT I-STAT CREATININE: Creatinine, Ser: 1 mg/dL (ref 0.44–1.00)

## 2019-01-26 MED ORDER — IOHEXOL 300 MG/ML  SOLN
75.0000 mL | Freq: Once | INTRAMUSCULAR | Status: AC | PRN
  Administered 2019-01-26: 75 mL via INTRAVENOUS

## 2019-01-27 ENCOUNTER — Telehealth: Payer: Self-pay

## 2019-01-27 ENCOUNTER — Other Ambulatory Visit: Payer: Self-pay | Admitting: Internal Medicine

## 2019-01-27 DIAGNOSIS — E039 Hypothyroidism, unspecified: Secondary | ICD-10-CM

## 2019-01-27 DIAGNOSIS — E559 Vitamin D deficiency, unspecified: Secondary | ICD-10-CM

## 2019-01-27 DIAGNOSIS — I10 Essential (primary) hypertension: Secondary | ICD-10-CM

## 2019-01-27 DIAGNOSIS — Z13818 Encounter for screening for other digestive system disorders: Secondary | ICD-10-CM

## 2019-01-27 DIAGNOSIS — Z1159 Encounter for screening for other viral diseases: Secondary | ICD-10-CM

## 2019-01-27 NOTE — Telephone Encounter (Signed)
Left message for the patient to call the office and schedule a lab appt 1 week before her actual appointment on July 1. Nicole Herman,cma

## 2019-01-27 NOTE — Telephone Encounter (Signed)
Schedule patient for labs but typically I dont order new patient labs until I actually see the patient but this pandemic we can consider pre-ordering labs   She can schedule fasting labs 1 week before appt 03/15/2019 must come in fasting 8-12 hours nothing but water and meds and wear a mask covering mouth and nose   TMS

## 2019-01-27 NOTE — Telephone Encounter (Signed)
Copied from [place] [id number]. Topic: General - Other >> Jan 27, 2019  9:35 AM Carolyn Stare wrote:  Would like to become a new pt

## 2019-01-31 ENCOUNTER — Ambulatory Visit: Payer: No Typology Code available for payment source | Admitting: Internal Medicine

## 2019-02-13 ENCOUNTER — Encounter (INDEPENDENT_AMBULATORY_CARE_PROVIDER_SITE_OTHER): Payer: Self-pay | Admitting: Vascular Surgery

## 2019-02-13 ENCOUNTER — Ambulatory Visit (INDEPENDENT_AMBULATORY_CARE_PROVIDER_SITE_OTHER): Payer: No Typology Code available for payment source

## 2019-02-13 ENCOUNTER — Other Ambulatory Visit: Payer: Self-pay

## 2019-02-13 ENCOUNTER — Ambulatory Visit (INDEPENDENT_AMBULATORY_CARE_PROVIDER_SITE_OTHER): Payer: No Typology Code available for payment source | Admitting: Vascular Surgery

## 2019-02-13 VITALS — BP 154/83 | HR 61 | Resp 16 | Wt 134.4 lb

## 2019-02-13 DIAGNOSIS — E039 Hypothyroidism, unspecified: Secondary | ICD-10-CM

## 2019-02-13 DIAGNOSIS — I6523 Occlusion and stenosis of bilateral carotid arteries: Secondary | ICD-10-CM | POA: Diagnosis not present

## 2019-02-13 DIAGNOSIS — I6521 Occlusion and stenosis of right carotid artery: Secondary | ICD-10-CM | POA: Diagnosis not present

## 2019-02-13 DIAGNOSIS — F17210 Nicotine dependence, cigarettes, uncomplicated: Secondary | ICD-10-CM

## 2019-02-13 DIAGNOSIS — Z7982 Long term (current) use of aspirin: Secondary | ICD-10-CM

## 2019-02-13 DIAGNOSIS — E782 Mixed hyperlipidemia: Secondary | ICD-10-CM

## 2019-02-13 DIAGNOSIS — I10 Essential (primary) hypertension: Secondary | ICD-10-CM | POA: Diagnosis not present

## 2019-02-13 DIAGNOSIS — Z79899 Other long term (current) drug therapy: Secondary | ICD-10-CM

## 2019-02-13 NOTE — Progress Notes (Signed)
MRN : [id number]  Nicole Herman is a 63 y.o. (12-05-1955) female who presents with chief complaint of  Chief Complaint  Patient presents with  . Follow-up    ultrasound follow up  .  History of Present Illness:   The patient is seen for follow up evaluation of carotid stenosis status post CT with contrast. CT scan was done 01/26/2019 for evaluation of a neck mass. Patient reports that the test went well with no problems or complications.   The patient describes several episodes of  amaurosis fugax. There is no recent or interval TIA symptoms or focal motor deficits. There is no prior documented CVA.  The patient is taking enteric-coated aspirin 81 mg daily.  There is no history of migraine headaches. There is no history of seizures.  The patient has a history of coronary artery disease, no recent episodes of angina or shortness of breath. The patient denies PAD or claudication symptoms. There is a history of hyperlipidemia which is being treated with a statin.   CT scan is reviewed by me personally and shows 90% stenosis consistent with calcified plaque at the origin of the right internal carotid artery.   This correlates with today's ultrasound which is worse compared to the last scan and now is 80-99% stenosis of the right carotid  Current Meds  Medication Sig  . albuterol (VENTOLIN HFA) 108 (90 Base) MCG/ACT inhaler Inhale 2 puffs into the lungs every 6 (six) hours as needed for wheezing or shortness of breath. AND COUGH  . ALPRAZolam (XANAX) 0.25 MG tablet TAKE HALF TO ONE TAB AS NEED FOR ANXIETY AND SHAKES  . aspirin 81 MG chewable tablet Chew by mouth.  . cholecalciferol (VITAMIN D3) 25 MCG (1000 UT) tablet Take 1,000 Units by mouth daily.  . diclofenac sodium (VOLTAREN) 1 % GEL Apply 4 g topically daily as needed.  Marland Kitchen escitalopram (LEXAPRO) 10 MG tablet Take 1 tablet (10 mg total) by mouth daily. FOR GAD  . estradiol (ESTRACE) 0.5 MG tablet Take 1/2  Tab  By po  daily  . fluconazole (DIFLUCAN) 150 MG tablet Take 1 tab po once and repeat if symptoms persisits  . ibandronate (BONIVA) 150 MG tablet Take 1 tablet (150 mg total) by mouth every 30 (thirty) days. Take in the morning with a full glass of water, on an empty stomach,  . levothyroxine (SYNTHROID) 75 MCG tablet Take 1 tablet (75 mcg total) by mouth daily before breakfast.  . nystatin (MYCOSTATIN) 100000 UNIT/ML suspension 5 ml swish and swallow  3 times a day for 5 to 7 days  . ondansetron (ZOFRAN ODT) 4 MG disintegrating tablet Take 1 tablet (4 mg total) by mouth every 8 (eight) hours as needed.  . pantoprazole (PROTONIX) 40 MG tablet TAKE 1 TABLET(40 MG) BY MOUTH TWICE DAILY  . promethazine (PHENERGAN) 12.5 MG tablet Take 1 tab po TID prn for nausea  . simvastatin (ZOCOR) 40 MG tablet Take 1 tablet (40 mg total) by mouth at bedtime.  Marland Kitchen telmisartan (MICARDIS) 40 MG tablet Take 1/2 tab po daily    Past Medical History:  Diagnosis Date  . Coronary artery disease   . GERD (gastroesophageal reflux disease)   . Hypertension   . Thyroid disease     Past Surgical History:  Procedure Laterality Date  . ABDOMINAL HYSTERECTOMY    . CAROTID ENDARTERECTOMY    . TRIGGER FINGER RELEASE      Social History Social History   Tobacco Use  .  Smoking status: Current Every Day Smoker    Packs/day: 1.00    Types: Cigarettes  . Smokeless tobacco: Never Used  Substance Use Topics  . Alcohol use: No  . Drug use: No    Family History Family History  Problem Relation Age of Onset  . Hypertension Mother   . Cancer Father   . Learning disabilities Brother   . Heart attack Maternal Grandmother   . Breast cancer Neg Hx     Allergies  Allergen Reactions  . Penicillins   . Other Rash    Per patient she is allergic to antibiotic.     REVIEW OF SYSTEMS (Negative unless checked)  Constitutional: [] Weight loss  [] Fever  [] Chills Cardiac: [] Chest pain   [] Chest pressure   [] Palpitations    [] Shortness of breath when laying flat   [] Shortness of breath with exertion. Vascular:  [] Pain in legs with walking   [] Pain in legs at rest  [] History of DVT   [] Phlebitis   [] Swelling in legs   [] Varicose veins   [] Non-healing ulcers Pulmonary:   [] Uses home oxygen   [] Productive cough   [] Hemoptysis   [] Wheeze  [] COPD   [] Asthma Neurologic:  [] Dizziness   [] Seizures   [] History of stroke   [] History of TIA  [] Aphasia   [x] Vissual changes   [] Weakness or numbness in arm   [] Weakness or numbness in leg Musculoskeletal:   [] Joint swelling   [x] Joint pain   [] Low back pain Hematologic:  [] Easy bruising  [] Easy bleeding   [] Hypercoagulable state   [] Anemic Gastrointestinal:  [] Diarrhea   [] Vomiting  [x] Gastroesophageal reflux/heartburn   [] Difficulty swallowing. Genitourinary:  [] Chronic kidney disease   [] Difficult urination  [] Frequent urination   [] Blood in urine Skin:  [] Rashes   [] Ulcers  Psychological:  [] History of anxiety   []  History of major depression.  Physical Examination  Vitals:   02/13/19 1114  BP: (!) 154/83  Pulse: 61  Resp: 16  Weight: 134 lb 6.4 oz (61 kg)   Body mass index is 23.07 kg/m. Gen: WD/WN, NAD Head: [place]/AT, No temporalis wasting.  Ear/Nose/Throat: Hearing grossly intact, nares w/o erythema or drainage Eyes: PER, EOMI, sclera nonicteric.  Neck: Supple, no large masses.   Pulmonary:  Good air movement, no audible wheezing bilaterally, no use of accessory muscles.  Cardiac: RRR, no JVD Vascular:  Right carotid bruit Vessel Right Left  Radial Palpable Palpable  Brachial Palpable Palpable  Carotid Palpable Palpable  Gastrointestinal: Non-distended. No guarding/no peritoneal signs.  Musculoskeletal: M/S 5/5 throughout.  No deformity or atrophy.  Neurologic: CN 2-12 intact. Symmetrical.  Speech is fluent. Motor exam as listed above. Psychiatric: Judgment intact, Mood & affect appropriate for pt's clinical situation. Dermatologic: No rashes or ulcers noted.   No changes consistent with cellulitis. Lymph : No lichenification or skin changes of chronic lymphedema.  CBC Lab Results  Component Value Date   WBC 6.4 09/01/2017   HGB 15.4 09/01/2017   HCT 46.0 09/01/2017   MCV 92 09/01/2017   PLT 202 09/01/2017    BMET    Component Value Date/Time   NA 139 09/27/2018 0922   NA 139 12/14/2011 0731   K 4.5 09/27/2018 0922   K 4.5 12/14/2011 0731   CL 100 09/27/2018 0922   CL 103 12/14/2011 0731   CO2 22 09/27/2018 0922   CO2 30 12/14/2011 0731   GLUCOSE 93 09/27/2018 0922   GLUCOSE 126 (H) 12/14/2011 0731   BUN 15 09/27/2018 0922   BUN  14 12/14/2011 0731   CREATININE 1.00 01/26/2019 1523   CREATININE 1.03 12/14/2011 0731   CALCIUM 10.1 09/27/2018 0922   CALCIUM 9.1 12/14/2011 0731   GFRNONAA 58 (L) 09/27/2018 0922   GFRNONAA 59 (L) 12/14/2011 0731   GFRAA 67 09/27/2018 0922   GFRAA >60 12/14/2011 0731   Estimated Creatinine Clearance: 49.7 mL/min (by C-G formula based on SCr of 1 mg/dL).  COAG No results found for: INR, PROTIME  Radiology Ct Soft Tissue Neck W Contrast  Result Date: 01/27/2019 CLINICAL DATA:  Left neck mass. Odynophagia. EXAM: CT NECK WITH CONTRAST TECHNIQUE: Multidetector CT imaging of the neck was performed using the standard protocol following the bolus administration of intravenous contrast. CONTRAST:  60mL OMNIPAQUE IOHEXOL 300 MG/ML  SOLN COMPARISON:  Neck CTA 06/20/2018. Neck MRI 10/06/2012. FINDINGS: Pharynx and larynx: No evidence of pharyngeal mass or swelling. No retropharyngeal fluid. Unremarkable larynx. Salivary glands: Chronic asymmetry of the submandibular glands with the left being slightly larger and extending slightly more inferiorly than the right, unchanged from the 2014 MRI. No submandibular or parotid mass, calculi, or acute inflammation identified. Thyroid: Unchanged diminutive right and possibly absent left thyroid lobes. Lymph nodes: No enlarged or suspicious lymph nodes in the neck. Small  lymph nodes located to the right of the trachea and esophagus near the thoracic inlet measure up to 6 mm in short axis, unchanged. Vascular: Aortic and carotid artery atherosclerosis with proximal right ICA stenosis more fully evaluated on the prior CTA. Prior left carotid endarterectomy. Limited intracranial: Unremarkable. Visualized orbits: Unremarkable. Mastoids and visualized paranasal sinuses: Clear. Skeleton: Moderate disc degeneration in the mid and lower cervical spine. Upper chest: Mild centrilobular emphysema. Other: The marker used indicate the area of palpable concern is located in the lateral left upper neck over the sternocleidomastoid muscle and slightly posterior to the left submandibular gland. No underlying mass, enlarged lymph node, fluid collection, or acute inflammatory changes are seen. IMPRESSION: 1. No mass or acute abnormality identified in the neck. 2. Chronic mild submandibular gland asymmetry. 3. Aortic Atherosclerosis (ICD10-I70.0) and Emphysema (ICD10-J43.9). Electronically Signed   By: Logan Bores M.D.   On: 01/27/2019 09:32     Assessment/Plan 1. Symptomatic stenosis of right carotid artery without infarction Recommend:  The patient is symptomatic with respect to the carotid stenosis.  The patient now has progressed and has a lesion the is >90%.  Patient's CT angiography of the carotid arteries confirms >90% right ICA stenosis.  The anatomical considerations support stenting over surgery.  This was discussed in detail with the patient.  The risks, benefits and alternative therapies were reviewed in detail with the patient.  All questions were answered.  The patient agrees to proceed with stenting of the right carotid artery.  Continue antiplatelet therapy as prescribed. Continue management of CAD, HTN and Hyperlipidemia. Healthy heart diet, encouraged exercise at least 4 times per week.    A total of 30 minutes was spent with this patient and greater than 50% was  spent in counseling and coordination of care with the patient.  Discussion included the treatment options for vascular disease including indications for surgery and intervention.  Also discussed is the appropriate timing of treatment.  In addition medical therapy was discussed.  2. Essential hypertension Continue antihypertensive medications as already ordered, these medications have been reviewed and there are no changes at this time.   3. Mixed hyperlipidemia Continue statin as ordered and reviewed, no changes at this time   4. Acquired hypothyroidism Continue  Synthroid as already ordered, these medications have been reviewed and there are no changes at this time.     Hortencia Pilar, MD  02/13/2019 11:22 AM

## 2019-02-14 ENCOUNTER — Telehealth (INDEPENDENT_AMBULATORY_CARE_PROVIDER_SITE_OTHER): Payer: Self-pay

## 2019-02-14 ENCOUNTER — Telehealth: Payer: Self-pay

## 2019-02-14 NOTE — Telephone Encounter (Signed)
Copied from [place] [id number]. Topic: General - Other >> Feb 14, 2019  8:03 AM Carolyn Stare wrote:  Pt is asking if there is an earlier appt for NP with Dr Aundra Dubin

## 2019-02-14 NOTE — Telephone Encounter (Signed)
Patient left a message asking if Dr Delana Meyer will need her lab results she will getting done with new PCP. I left a message for the patient to return a call to the office

## 2019-02-14 NOTE — Telephone Encounter (Signed)
Patient will be going to see new PCP Dr Antionette Char in July and will like see provider for getting procedure done and wanted to inform Dr Delana Meyer.

## 2019-02-15 ENCOUNTER — Ambulatory Visit: Payer: Self-pay | Admitting: Adult Health

## 2019-02-22 ENCOUNTER — Other Ambulatory Visit: Payer: Self-pay | Admitting: Internal Medicine

## 2019-02-23 ENCOUNTER — Encounter (INDEPENDENT_AMBULATORY_CARE_PROVIDER_SITE_OTHER): Payer: Self-pay

## 2019-02-23 ENCOUNTER — Telehealth (INDEPENDENT_AMBULATORY_CARE_PROVIDER_SITE_OTHER): Payer: Self-pay

## 2019-02-23 NOTE — Telephone Encounter (Signed)
Spoke with the patient and she is now scheduled with Dr. Delana Meyer for 03/22/2019 with a 6:45 am arrival time. She will have Covid testing on 03/17/2019 at the [facility]. All pre-procedure instructions as well as zero visitor policy was discussed. This information will be mailed out to the patient.

## 2019-02-23 NOTE — Telephone Encounter (Signed)
I attempted to contact the patient regarding her procedure with Dr. Delana Meyer. A message was left for a return call.

## 2019-02-24 ENCOUNTER — Other Ambulatory Visit: Payer: Self-pay

## 2019-02-24 MED ORDER — SIMVASTATIN 40 MG PO TABS: 40.0000 mg | ORAL_TABLET | Freq: Every day | ORAL | 0 refills | Status: DC

## 2019-03-09 ENCOUNTER — Encounter: Payer: Self-pay | Admitting: *Deleted

## 2019-03-10 ENCOUNTER — Other Ambulatory Visit: Payer: Self-pay

## 2019-03-10 ENCOUNTER — Other Ambulatory Visit (INDEPENDENT_AMBULATORY_CARE_PROVIDER_SITE_OTHER): Payer: No Typology Code available for payment source

## 2019-03-10 DIAGNOSIS — I10 Essential (primary) hypertension: Secondary | ICD-10-CM | POA: Diagnosis not present

## 2019-03-10 DIAGNOSIS — E039 Hypothyroidism, unspecified: Secondary | ICD-10-CM

## 2019-03-10 DIAGNOSIS — Z1159 Encounter for screening for other viral diseases: Secondary | ICD-10-CM

## 2019-03-10 DIAGNOSIS — E559 Vitamin D deficiency, unspecified: Secondary | ICD-10-CM | POA: Diagnosis not present

## 2019-03-10 DIAGNOSIS — Z13818 Encounter for screening for other digestive system disorders: Secondary | ICD-10-CM

## 2019-03-10 LAB — LIPID PANEL
Cholesterol: 227 mg/dL — ABNORMAL HIGH (ref 0–200)
HDL: 74.8 mg/dL (ref >39.00)
LDL Cholesterol: 140 mg/dL — ABNORMAL HIGH (ref 0–99)
NonHDL: 151.88
Total CHOL/HDL Ratio: 3
Triglycerides: 57 mg/dL (ref 0.0–149.0)
VLDL: 11.4 mg/dL (ref 0.0–40.0)

## 2019-03-10 LAB — CBC WITH DIFFERENTIAL/PLATELET
Basophils Absolute: 0.1 10*3/uL (ref 0.0–0.1)
Basophils Relative: 1.8 % (ref 0.0–3.0)
Eosinophils Absolute: 0.2 10*3/uL (ref 0.0–0.7)
Eosinophils Relative: 3 % (ref 0.0–5.0)
HCT: 39.3 % (ref 36.0–46.0)
Hemoglobin: 12.9 g/dL (ref 12.0–15.0)
Lymphocytes Relative: 28.2 % (ref 12.0–46.0)
Lymphs Abs: 1.7 10*3/uL (ref 0.7–4.0)
MCHC: 32.9 g/dL (ref 30.0–36.0)
MCV: 89 fl (ref 78.0–100.0)
Monocytes Absolute: 0.4 10*3/uL (ref 0.1–1.0)
Monocytes Relative: 6.3 % (ref 3.0–12.0)
Neutro Abs: 3.7 10*3/uL (ref 1.4–7.7)
Neutrophils Relative %: 60.7 % (ref 43.0–77.0)
Platelets: 177 10*3/uL (ref 150.0–400.0)
RBC: 4.41 Mil/uL (ref 3.87–5.11)
RDW: 14.3 % (ref 11.5–15.5)
WBC: 6.1 10*3/uL (ref 4.0–10.5)

## 2019-03-10 LAB — COMPREHENSIVE METABOLIC PANEL
ALT: 16 U/L (ref 0–35)
AST: 22 U/L (ref 0–37)
Albumin: 4.2 g/dL (ref 3.5–5.2)
Alkaline Phosphatase: 84 U/L (ref 39–117)
BUN: 19 mg/dL (ref 6–23)
CO2: 27 mEq/L (ref 19–32)
Calcium: 9.2 mg/dL (ref 8.4–10.5)
Chloride: 106 mEq/L (ref 96–112)
Creatinine, Ser: 0.98 mg/dL (ref 0.40–1.20)
GFR: 57.24 mL/min — ABNORMAL LOW (ref >60.00)
Glucose, Bld: 109 mg/dL — ABNORMAL HIGH (ref 70–99)
Potassium: 3.9 mEq/L (ref 3.5–5.1)
Sodium: 141 mEq/L (ref 135–145)
Total Bilirubin: 0.6 mg/dL (ref 0.2–1.2)
Total Protein: 6.1 g/dL (ref 6.0–8.3)

## 2019-03-10 LAB — TSH: TSH: 1.83 u[IU]/mL (ref 0.35–4.50)

## 2019-03-10 LAB — VITAMIN D 25 HYDROXY (VIT D DEFICIENCY, FRACTURES): VITD: 35.2 ng/mL (ref 30.00–100.00)

## 2019-03-13 LAB — HEPATITIS C ANTIBODY
Hepatitis C Ab: NONREACTIVE
SIGNAL TO CUT-OFF: 0.01 (ref <1.00)

## 2019-03-14 ENCOUNTER — Other Ambulatory Visit (INDEPENDENT_AMBULATORY_CARE_PROVIDER_SITE_OTHER): Payer: Self-pay | Admitting: Nurse Practitioner

## 2019-03-15 ENCOUNTER — Telehealth: Payer: Self-pay | Admitting: Internal Medicine

## 2019-03-15 ENCOUNTER — Encounter: Payer: Self-pay | Admitting: Internal Medicine

## 2019-03-15 ENCOUNTER — Ambulatory Visit (INDEPENDENT_AMBULATORY_CARE_PROVIDER_SITE_OTHER): Payer: No Typology Code available for payment source | Admitting: Internal Medicine

## 2019-03-15 ENCOUNTER — Other Ambulatory Visit: Payer: Self-pay

## 2019-03-15 VITALS — Wt 133.4 lb

## 2019-03-15 DIAGNOSIS — Z720 Tobacco use: Secondary | ICD-10-CM | POA: Diagnosis not present

## 2019-03-15 DIAGNOSIS — J449 Chronic obstructive pulmonary disease, unspecified: Secondary | ICD-10-CM | POA: Diagnosis not present

## 2019-03-15 DIAGNOSIS — R252 Cramp and spasm: Secondary | ICD-10-CM | POA: Diagnosis not present

## 2019-03-15 DIAGNOSIS — E039 Hypothyroidism, unspecified: Secondary | ICD-10-CM

## 2019-03-15 DIAGNOSIS — J040 Acute laryngitis: Secondary | ICD-10-CM | POA: Insufficient documentation

## 2019-03-15 DIAGNOSIS — I10 Essential (primary) hypertension: Secondary | ICD-10-CM

## 2019-03-15 DIAGNOSIS — I6521 Occlusion and stenosis of right carotid artery: Secondary | ICD-10-CM

## 2019-03-15 DIAGNOSIS — E785 Hyperlipidemia, unspecified: Secondary | ICD-10-CM

## 2019-03-15 MED ORDER — EZETIMIBE 10 MG PO TABS: 10.0000 mg | ORAL_TABLET | Freq: Every day | ORAL | 3 refills | Status: DC

## 2019-03-15 NOTE — Progress Notes (Addendum)
Virtual Visit via Video Note  I connected with Nicole Herman   on 03/15/19 at  8:10 AM EDT by a video enabled telemedicine application and verified that I am speaking with the correct person using two identifiers.  Location patient: home Location provider:work Persons participating in the virtual visit: patient, provider  I discussed the limitations of evaluation and management by telemedicine and the availability of in person appointments. The patient expressed understanding and agreed to proceed.   HPI: 1.She c/o nasal congestion and allergy testing negative with ENT but did not have comprehensive allergy testing she f/u with Dr. Pryor Ochoa ENT  2. Laryngitis since 11/2018 since coming back from [place]  -she was given Zpack, steroids x 2 courses, tx'ed for thrush and tx'ed. She did not like the way the steroids made her feel with increased energy and reduced sleep.   ENT with polyps noted on inspection she also has trouble with smell and will f/u again 05/01/19  CT neck 01/26/19  IMPRESSION: No mass or acute abnormality identified in the neck. Chronic mild submandibular gland asymmetry.  Aortic Atherosclerosis (ICD10-I70.0) and Emphysema (ICD10-J43.9).  -she also has appt with Dr. Marius Ditch GI to f/u   3. Tobacco abuse x years she tried wellbutrin did not like the way it made her feel and was on Chantix but caused nightmares  4. Hypothyroidism on levothyroxine 75 mcg qd  5. S/p left CEA pending surgery with Dr. Delana Meyer right and possibly getting a stent  6. HLD-she is taking zocor 40 mg qd and does not want to change to lipitor 40 due to possible side effects but wants to add zetia  7. HTN on micardis 20 mg qd Per pt could not tolerate higher doses of BP medication    ROS: See pertinent positives and negatives per HPI.  Past Medical History:  Diagnosis Date  . Coronary artery disease    s/p carotid surgery left   . Does use hearing aid   . Fibromyalgia   . GERD (gastroesophageal reflux  disease)   . Hypertension   . Thyroid disease     Past Surgical History:  Procedure Laterality Date  . ABDOMINAL HYSTERECTOMY    . CAROTID ENDARTERECTOMY     left   . STAPEDES SURGERY     otosclerosis right   . TRIGGER FINGER RELEASE      Family History  Problem Relation Age of Onset  . Hypertension Mother   . Dementia Mother   . Cancer Father        colon   . Learning disabilities Brother   . Colon polyps Brother   . Heart attack Maternal Grandmother   . Colon polyps Sister     SOCIAL HX:  1 son and 1 grandchild in [place] in [place]  1 fiance of 14 years as of 03/15/2019 (with h/o alcoholism, cardiac defibrillator, liver failure)    Current Outpatient Medications:  .  albuterol (VENTOLIN HFA) 108 (90 Base) MCG/ACT inhaler, Inhale 2 puffs into the lungs every 6 (six) hours as needed for wheezing or shortness of breath. AND COUGH (Patient taking differently: Inhale 2 puffs into the lungs every 6 (six) hours as needed (wheezing/shortness of breath/cough.). ), Disp: 1 Inhaler, Rfl: 0 .  aspirin 81 MG chewable tablet, Chew 81 mg by mouth daily. , Disp: , Rfl:  .  Calcium-Magnesium-Vitamin D (CALCIUM 1200+D3 PO), Take 1 tablet by mouth 2 (two) times a week., Disp: , Rfl:  .  Dextromethorphan-guaiFENesin (CORICIDIN HBP CONGESTION/COUGH) 10-200  MG CAPS, Take 1-2 tablets by mouth daily as needed (for cough/congestion.)., Disp: , Rfl:  .  diclofenac sodium (VOLTAREN) 1 % GEL, Apply 4 g topically 2 (two) times daily as needed (neck pain.). , Disp: , Rfl:  .  escitalopram (LEXAPRO) 10 MG tablet, Take 1 tablet (10 mg total) by mouth daily. FOR GAD, Disp: 30 tablet, Rfl: 2 .  estradiol (ESTRACE) 0.5 MG tablet, Take 1/2  Tab  By po daily (Patient taking differently: Take 0.25 mg by mouth daily. ), Disp: 30 tablet, Rfl: 3 .  fluticasone (FLONASE) 50 MCG/ACT nasal spray, Place 1 spray into both nostrils daily., Disp: , Rfl:  .  ibuprofen (ADVIL) 200 MG tablet, Take 400 mg by mouth every 8  (eight) hours as needed (pain.)., Disp: , Rfl:  .  levothyroxine (SYNTHROID) 75 MCG tablet, TAKE 1 TABLET(75 MCG) BY MOUTH DAILY BEFORE BREAKFAST (Patient taking differently: Take 75 mcg by mouth daily before breakfast. ), Disp: 30 tablet, Rfl: 0 .  ondansetron (ZOFRAN ODT) 4 MG disintegrating tablet, Take 1 tablet (4 mg total) by mouth every 8 (eight) hours as needed., Disp: 20 tablet, Rfl: 0 .  pantoprazole (PROTONIX) 40 MG tablet, TAKE 1 TABLET(40 MG) BY MOUTH TWICE DAILY (Patient taking differently: Take 40 mg by mouth 2 (two) times a day. ), Disp: 60 tablet, Rfl: 3 .  simvastatin (ZOCOR) 40 MG tablet, Take 1 tablet (40 mg total) by mouth at bedtime., Disp: 30 tablet, Rfl: 0 .  telmisartan (MICARDIS) 40 MG tablet, Take 1/2 tab po daily (Patient taking differently: Take 20 mg by mouth daily. Take 1/2 tab po daily), Disp: 30 tablet, Rfl: 1 .  ALPRAZolam (XANAX) 0.25 MG tablet, TAKE HALF TO ONE TAB AS NEED FOR ANXIETY AND SHAKES (Patient not taking: Reported on 03/13/2019), Disp: 20 tablet, Rfl: 1 .  ezetimibe (ZETIA) 10 MG tablet, Take 1 tablet (10 mg total) by mouth daily., Disp: 90 tablet, Rfl: 3  EXAM:  VITALS per patient if applicable:  GENERAL: alert, oriented, appears well and in no acute distress  HEENT: atraumatic, conjunttiva clear, no obvious abnormalities on inspection of external nose and ears  NECK: normal movements of the head and neck  LUNGS: on inspection no signs of respiratory distress, breathing rate appears normal, no obvious gross SOB, gasping or wheezing  CV: no obvious cyanosis  MS: moves all visible extremities without noticeable abnormality  PSYCH/NEURO: pleasant and cooperative, no obvious depression or anxiety, speech and thought processing grossly intact  ASSESSMENT AND PLAN:  Discussed the following assessment and plan:  Leg cramps - Plan: disc theraworx otc increase water intake   Chronic obstructive pulmonary disease not on CT neck - Plan: given sample  x 2 anoro 14 day supply  -consider pulmonary in the future   Tobacco abuse - Plan: rec smoking cessation could not tolerate chantix or wellbutrin in the past   hypothyroidism - Plan: cont same dose of levo 75 mcg qd   Essential hypertension/hld - Plan: cont micardis 20 mg qd  -cont zocor 40 does not want to change to lipitor 40, add zetia 10 mg qd   Symptomatic stenosis of right carotid artery without infarction - Plan: pending surgery with Dr. Ronalee Belts [facility]   laryngitis Will f/u with Dr. Pryor Ochoa ENT 05/01/19 See CT neck  GI f/u also scheduled Dr. Marius Ditch   HM Disc flu shot upcoming, Tdap, shingrix, pna 23   Mammogram 10/06/18 negative   Pap s/p hysterectomy   Colonoscopy rec get  with EGD upcoming appt with Dr. Marius Ditch [facility] colon cancer in her day   DEXA 10/06/18 osteoporosis T -2.9 consider prolia she does take calcium 1200 mg qd and 25 mcg vitamin D3  -consider prolia injections   Tobacco abuse quantify at f/u   Consider A1C in future  Former PCP Dr. Clayborn Bigness  ENT Dr. Pryor Ochoa [facility] Dr. Clayborn Bigness cards  -pt saw 04/27/19 echo/NM stress test pending 05/29/19 and f/u 06/05/19 on telmisartan 20 mg qd, protonix 40 mg bid, plavix 75 mg qd and changed zocor to a stronger alternative need to check with pharmacy to see which one [facility] in [place] his note is not complete yet as of 05/01/19    I discussed the assessment and treatment plan with the patient. The patient was provided an opportunity to ask questions and all were answered. The patient agreed with the plan and demonstrated an understanding of the instructions.   The patient was advised to call back or seek an in-person evaluation if the symptoms worsen or if the condition fails to improve as anticipated.  Time spent 25 minutes  Delorise Jackson, MD

## 2019-03-15 NOTE — Telephone Encounter (Signed)
Pt wants to do prolia for osteoporosis   Thanks [place]

## 2019-03-15 NOTE — Patient Instructions (Addendum)
Theraworx for leg cramps   High Cholesterol  High cholesterol is a condition in which the blood has high levels of a white, waxy, fat-like substance (cholesterol). The human body needs small amounts of cholesterol. The liver makes all the cholesterol that the body needs. Extra (excess) cholesterol comes from the food that we eat. Cholesterol is carried from the liver by the blood through the blood vessels. If you have high cholesterol, deposits (plaques) may build up on the walls of your blood vessels (arteries). Plaques make the arteries narrower and stiffer. Cholesterol plaques increase your risk for heart attack and stroke. Work with your health care provider to keep your cholesterol levels in a healthy range. What increases the risk? This condition is more likely to develop in people who:  Eat foods that are high in animal fat (saturated fat) or cholesterol.  Are overweight.  Are not getting enough exercise.  Have a family history of high cholesterol. What are the signs or symptoms? There are no symptoms of this condition. How is this diagnosed? This condition may be diagnosed from the results of a blood test.  If you are older than age 4, your health care provider may check your cholesterol every 4-6 years.  You may be checked more often if you already have high cholesterol or other risk factors for heart disease. The blood test for cholesterol measures:  "Bad" cholesterol (LDL cholesterol). This is the main type of cholesterol that causes heart disease. The desired level for LDL is less than 100.  "Good" cholesterol (HDL cholesterol). This type helps to protect against heart disease by cleaning the arteries and carrying the LDL away. The desired level for HDL is 60 or higher.  Triglycerides. These are fats that the body can store or burn for energy. The desired number for triglycerides is lower than 150.  Total cholesterol. This is a measure of the total amount of cholesterol  in your blood, including LDL cholesterol, HDL cholesterol, and triglycerides. A healthy number is less than 200. How is this treated? This condition is treated with diet changes, lifestyle changes, and medicines. Diet changes  This may include eating more whole grains, fruits, vegetables, nuts, and fish.  This may also include cutting back on red meat and foods that have a lot of added sugar. Lifestyle changes  Changes may include getting at least 40 minutes of aerobic exercise 3 times a week. Aerobic exercises include walking, biking, and swimming. Aerobic exercise along with a healthy diet can help you maintain a healthy weight.  Changes may also include quitting smoking. Medicines  Medicines are usually given if diet and lifestyle changes have failed to reduce your cholesterol to healthy levels.  Your health care provider may prescribe a statin medicine. Statin medicines have been shown to reduce cholesterol, which can reduce the risk of heart disease. Follow these instructions at home: Eating and drinking If told by your health care provider:  Eat chicken (without skin), fish, veal, shellfish, ground [place] breast, and round or loin cuts of red meat.  Do not eat fried foods or fatty meats, such as hot dogs and salami.  Eat plenty of fruits, such as apples.  Eat plenty of vegetables, such as broccoli, potatoes, and carrots.  Eat beans, peas, and lentils.  Eat grains such as barley, rice, couscous, and bulgur wheat.  Eat pasta without cream sauces.  Use skim or nonfat milk, and eat low-fat or nonfat yogurt and cheeses.  Do not eat or drink whole  milk, cream, ice cream, egg yolks, or hard cheeses.  Do not eat stick margarine or tub margarines that contain trans fats (also called partially hydrogenated oils).  Do not eat saturated tropical oils, such as coconut oil and palm oil.  Do not eat cakes, cookies, crackers, or other baked goods that contain trans fats.  General  instructions  Exercise as directed by your health care provider. Increase your activity level with activities such as gardening, walking, and taking the stairs.  Take over-the-counter and prescription medicines only as told by your health care provider.  Do not use any products that contain nicotine or tobacco, such as cigarettes and e-cigarettes. If you need help quitting, ask your health care provider.  Keep all follow-up visits as told by your health care provider. This is important. Contact a health care provider if:  You are struggling to maintain a healthy diet or weight.  You need help to start on an exercise program.  You need help to stop smoking. Get help right away if:  You have chest pain.  You have trouble breathing. This information is not intended to replace advice given to you by your health care provider. Make sure you discuss any questions you have with your health care provider. Document Released: 08/31/2005 Document Revised: 09/03/2017 Document Reviewed: 02/29/2016 Elsevier Patient Education  2020 [employer].  Osteoporosis  Osteoporosis is thinning and loss of density in your bones. Osteoporosis makes bones more brittle and fragile and more likely to break (fracture). Over time, osteoporosis can cause your bones to become so weak that they fracture after a minor fall. Bones in the hip, wrist, and spine are most likely to fracture due to osteoporosis. What are the causes? The exact cause of this condition is not known. What increases the risk? You may be at greater risk for osteoporosis if you:  Have a family history of the condition.  Have poor nutrition.  Use steroid medicines, such as prednisone.  Are female.  Are age 89 or older.  Smoke or have a history of smoking.  Are not physically active (are sedentary).  Are white (Caucasian) or of Asian descent.  Have a small body frame.  Take certain medicines, such as antiseizure medicines. What  are the signs or symptoms? A fracture might be the first sign of osteoporosis, especially if the fracture results from a fall or injury that usually would not cause a bone to break. Other signs and symptoms include:  Pain in the neck or low back.  Stooped posture.  Loss of height. How is this diagnosed? This condition may be diagnosed based on:  Your medical history.  A physical exam.  A bone mineral density test, also called a DXA or DEXA test (dual-energy X-ray absorptiometry test). This test uses X-rays to measure the amount of minerals in your bones. How is this treated? The goal of treatment is to strengthen your bones and lower your risk for a fracture. Treatment may involve:  Making lifestyle changes, such as: ? Including foods with more calcium and vitamin D in your diet. ? Doing weight-bearing and muscle-strengthening exercises. ? Stopping tobacco use. ? Limiting alcohol intake.  Taking medicine to slow the process of bone loss or to increase bone density.  Taking daily supplements of calcium and vitamin D.  Taking hormone replacement medicines, such as estrogen for women and testosterone for men.  Monitoring your levels of calcium and vitamin D. Follow these instructions at home:  Activity  Exercise as told  by your health care provider. Ask your health care provider what exercises and activities are safe for you. You should do: ? Exercises that make you work against gravity (weight-bearing exercises), such as tai chi, yoga, or walking. ? Exercises to strengthen muscles, such as lifting weights. Lifestyle  Limit alcohol intake to no more than 1 drink a day for nonpregnant women and 2 drinks a day for men. One drink equals 12 oz of beer, 5 oz of wine, or 1 oz of hard liquor.  Do not use any products that contain nicotine or tobacco, such as cigarettes and e-cigarettes. If you need help quitting, ask your health care provider. Preventing falls  Use devices to  help you move around (mobility aids) as needed, such as canes, walkers, scooters, or crutches.  Keep rooms well-lit and clutter-free.  Remove tripping hazards from walkways, including cords and throw rugs.  Install grab bars in bathrooms and safety rails on stairs.  Use rubber mats in the bathroom and other areas that are often wet or slippery.  Wear closed-toe shoes that fit well and support your feet. Wear shoes that have rubber soles or low heels.  Review your medicines with your health care provider. Some medicines can cause dizziness or changes in blood pressure, which can increase your risk of falling. General instructions  Include calcium and vitamin D in your diet. Calcium is important for bone health, and vitamin D helps your body to absorb calcium. Good sources of calcium and vitamin D include: ? Certain fatty fish, such as salmon and tuna. ? Products that have calcium and vitamin D added to them (fortified products), such as fortified cereals. ? Egg yolks. ? Cheese. ? Liver.  Take over-the-counter and prescription medicines only as told by your health care provider.  Keep all follow-up visits as told by your health care provider. This is important. Contact a health care provider if:  You have never been screened for osteoporosis and you are: ? A woman who is age 22 or older. ? A man who is age 22 or older. Get help right away if:  You fall or injure yourself. Summary  Osteoporosis is thinning and loss of density in your bones. This makes bones more brittle and fragile and more likely to break (fracture),even with minor falls.  The goal of treatment is to strengthen your bones and reduce your risk for a fracture.  Include calcium and vitamin D in your diet. Calcium is important for bone health, and vitamin D helps your body to absorb calcium.  Talk with your health care provider about screening for osteoporosis if you are a woman who is age 15 or older, or a man who  is age 50 or older. This information is not intended to replace advice given to you by your health care provider. Make sure you discuss any questions you have with your health care provider. Document Released: 06/10/2005 Document Revised: 08/13/2017 Document Reviewed: 06/25/2017 Elsevier Patient Education  [place] Denosumab injection What is this medicine? DENOSUMAB (den oh sue mab) slows bone breakdown. Prolia is used to treat osteoporosis in women after menopause and in men, and in people who are taking corticosteroids for 6 months or more. Delton See is used to treat a high calcium level due to cancer and to prevent bone fractures and other bone problems caused by multiple myeloma or cancer bone metastases. Delton See is also used to treat giant cell tumor of the bone. This medicine may be used  for other purposes; ask your health care provider or pharmacist if you have questions. COMMON BRAND NAME(S): Prolia, XGEVA What should I tell my health care provider before I take this medicine? They need to know if you have any of these conditions:  dental disease  having surgery or tooth extraction  infection  kidney disease  low levels of calcium or Vitamin D in the blood  malnutrition  on hemodialysis  skin conditions or sensitivity  thyroid or parathyroid disease  an unusual reaction to denosumab, other medicines, foods, dyes, or preservatives  pregnant or trying to get pregnant  breast-feeding How should I use this medicine? This medicine is for injection under the skin. It is given by a health care professional in a hospital or clinic setting. A special MedGuide will be given to you before each treatment. Be sure to read this information carefully each time. For Prolia, talk to your pediatrician regarding the use of this medicine in children. Special care may be needed. For Delton See, talk to your pediatrician regarding the use of this medicine in children. While this  drug may be prescribed for children as young as 13 years for selected conditions, precautions do apply. Overdosage: If you think you have taken too much of this medicine contact a poison control center or emergency room at once. NOTE: This medicine is only for you. Do not share this medicine with others. What if I miss a dose? It is important not to miss your dose. Call your doctor or health care professional if you are unable to keep an appointment. What may interact with this medicine? Do not take this medicine with any of the following medications:  other medicines containing denosumab This medicine may also interact with the following medications:  medicines that lower your chance of fighting infection  steroid medicines like prednisone or cortisone This list may not describe all possible interactions. Give your health care provider a list of all the medicines, herbs, non-prescription drugs, or dietary supplements you use. Also tell them if you smoke, drink alcohol, or use illegal drugs. Some items may interact with your medicine. What should I watch for while using this medicine? Visit your doctor or health care professional for regular checks on your progress. Your doctor or health care professional may order blood tests and other tests to see how you are doing. Call your doctor or health care professional for advice if you get a fever, chills or sore throat, or other symptoms of a cold or flu. Do not treat yourself. This drug may decrease your body's ability to fight infection. Try to avoid being around people who are sick. You should make sure you get enough calcium and vitamin D while you are taking this medicine, unless your doctor tells you not to. Discuss the foods you eat and the vitamins you take with your health care professional. See your dentist regularly. Brush and floss your teeth as directed. Before you have any dental work done, tell your dentist you are receiving this  medicine. Do not become pregnant while taking this medicine or for 5 months after stopping it. Talk with your doctor or health care professional about your birth control options while taking this medicine. Women should inform their doctor if they wish to become pregnant or think they might be pregnant. There is a potential for serious side effects to an unborn child. Talk to your health care professional or pharmacist for more information. What side effects may I notice from receiving this  medicine? Side effects that you should report to your doctor or health care professional as soon as possible:  allergic reactions like skin rash, itching or hives, swelling of the face, lips, or tongue  bone pain  breathing problems  dizziness  jaw pain, especially after dental work  redness, blistering, peeling of the skin  signs and symptoms of infection like fever or chills; cough; sore throat; pain or trouble passing urine  signs of low calcium like fast heartbeat, muscle cramps or muscle pain; pain, tingling, numbness in the hands or feet; seizures  unusual bleeding or bruising  unusually weak or tired Side effects that usually do not require medical attention (report to your doctor or health care professional if they continue or are bothersome):  constipation  diarrhea  headache  joint pain  loss of appetite  muscle pain  runny nose  tiredness  upset stomach This list may not describe all possible side effects. Call your doctor for medical advice about side effects. You may report side effects to FDA at 1-800-FDA-1088. Where should I keep my medicine? This medicine is only given in a clinic, doctor's office, or other health care setting and will not be stored at home. NOTE: This sheet is a summary. It may not cover all possible information. If you have questions about this medicine, talk to your doctor, pharmacist, or health care provider.  2020 Elsevier/Gold Standard  (2018-01-07 16:10:44)

## 2019-03-17 ENCOUNTER — Other Ambulatory Visit: Payer: Self-pay

## 2019-03-17 ENCOUNTER — Other Ambulatory Visit
Admission: RE | Admit: 2019-03-17 | Discharge: 2019-03-17 | Disposition: A | Discharge status: Home / Self Care | Payer: BLUE CROSS/BLUE SHIELD | Source: Ambulatory Visit | Attending: Vascular Surgery | Admitting: Vascular Surgery

## 2019-03-17 DIAGNOSIS — Z1159 Encounter for screening for other viral diseases: Secondary | ICD-10-CM | POA: Diagnosis not present

## 2019-03-17 DIAGNOSIS — Z01812 Encounter for preprocedural laboratory examination: Secondary | ICD-10-CM | POA: Diagnosis present

## 2019-03-17 LAB — SARS CORONAVIRUS 2 (TAT 6-24 HRS): SARS Coronavirus 2: NEGATIVE

## 2019-03-17 LAB — CREATININE, SERUM
Creatinine, Ser: 0.89 mg/dL (ref 0.44–1.00)
GFR calc Af Amer: >60 mL/min (ref >60)
GFR calc non Af Amer: >60 mL/min (ref >60)

## 2019-03-17 LAB — BUN: BUN: 21 mg/dL (ref 8–23)

## 2019-03-20 ENCOUNTER — Encounter (INDEPENDENT_AMBULATORY_CARE_PROVIDER_SITE_OTHER): Payer: Self-pay | Admitting: Vascular Surgery

## 2019-03-20 ENCOUNTER — Other Ambulatory Visit: Payer: Self-pay

## 2019-03-20 ENCOUNTER — Ambulatory Visit (INDEPENDENT_AMBULATORY_CARE_PROVIDER_SITE_OTHER): Payer: No Typology Code available for payment source | Admitting: Vascular Surgery

## 2019-03-20 ENCOUNTER — Other Ambulatory Visit (INDEPENDENT_AMBULATORY_CARE_PROVIDER_SITE_OTHER): Payer: Self-pay | Admitting: Nurse Practitioner

## 2019-03-20 VITALS — BP 138/76 | HR 64 | Resp 16 | Wt 137.0 lb

## 2019-03-20 DIAGNOSIS — J449 Chronic obstructive pulmonary disease, unspecified: Secondary | ICD-10-CM | POA: Diagnosis not present

## 2019-03-20 DIAGNOSIS — F17210 Nicotine dependence, cigarettes, uncomplicated: Secondary | ICD-10-CM

## 2019-03-20 DIAGNOSIS — E782 Mixed hyperlipidemia: Secondary | ICD-10-CM | POA: Diagnosis not present

## 2019-03-20 DIAGNOSIS — Z7982 Long term (current) use of aspirin: Secondary | ICD-10-CM

## 2019-03-20 DIAGNOSIS — I10 Essential (primary) hypertension: Secondary | ICD-10-CM | POA: Diagnosis not present

## 2019-03-20 DIAGNOSIS — I6521 Occlusion and stenosis of right carotid artery: Secondary | ICD-10-CM | POA: Diagnosis not present

## 2019-03-21 ENCOUNTER — Other Ambulatory Visit: Payer: Self-pay | Admitting: Internal Medicine

## 2019-03-21 DIAGNOSIS — I10 Essential (primary) hypertension: Secondary | ICD-10-CM

## 2019-03-21 MED ORDER — CLINDAMYCIN PHOSPHATE 300 MG/50ML IV SOLN
300.0000 mg | Freq: Once | INTRAVENOUS | Status: AC
  Administered 2019-03-22: 300 mg via INTRAVENOUS

## 2019-03-21 MED ORDER — TELMISARTAN 40 MG PO TABS: 20.0000 mg | ORAL_TABLET | Freq: Every day | ORAL | 3 refills | Status: DC

## 2019-03-22 ENCOUNTER — Inpatient Hospital Stay
Admission: AD | Admit: 2019-03-22 | Discharge: 2019-03-24 | DRG: 036 | Disposition: A | Discharge status: Home / Self Care | Payer: BLUE CROSS/BLUE SHIELD | Attending: Vascular Surgery | Admitting: Vascular Surgery

## 2019-03-22 ENCOUNTER — Encounter (INDEPENDENT_AMBULATORY_CARE_PROVIDER_SITE_OTHER): Payer: Self-pay | Admitting: Vascular Surgery

## 2019-03-22 ENCOUNTER — Encounter
Admission: AD | Disposition: A | Discharge status: Home / Self Care | Payer: Self-pay | Source: Home / Self Care | Attending: Vascular Surgery

## 2019-03-22 ENCOUNTER — Other Ambulatory Visit: Payer: Self-pay

## 2019-03-22 ENCOUNTER — Encounter: Payer: Self-pay | Admitting: Internal Medicine

## 2019-03-22 DIAGNOSIS — Z23 Encounter for immunization: Secondary | ICD-10-CM

## 2019-03-22 DIAGNOSIS — Z974 Presence of external hearing-aid: Secondary | ICD-10-CM

## 2019-03-22 DIAGNOSIS — E782 Mixed hyperlipidemia: Secondary | ICD-10-CM | POA: Diagnosis present

## 2019-03-22 DIAGNOSIS — Z90710 Acquired absence of both cervix and uterus: Secondary | ICD-10-CM

## 2019-03-22 DIAGNOSIS — I739 Peripheral vascular disease, unspecified: Secondary | ICD-10-CM | POA: Diagnosis present

## 2019-03-22 DIAGNOSIS — I9581 Postprocedural hypotension: Secondary | ICD-10-CM | POA: Diagnosis not present

## 2019-03-22 DIAGNOSIS — K219 Gastro-esophageal reflux disease without esophagitis: Secondary | ICD-10-CM | POA: Diagnosis present

## 2019-03-22 DIAGNOSIS — Z8673 Personal history of transient ischemic attack (TIA), and cerebral infarction without residual deficits: Secondary | ICD-10-CM | POA: Diagnosis not present

## 2019-03-22 DIAGNOSIS — R309 Painful micturition, unspecified: Secondary | ICD-10-CM | POA: Diagnosis not present

## 2019-03-22 DIAGNOSIS — Z800 Family history of malignant neoplasm of digestive organs: Secondary | ICD-10-CM | POA: Diagnosis not present

## 2019-03-22 DIAGNOSIS — M797 Fibromyalgia: Secondary | ICD-10-CM | POA: Diagnosis present

## 2019-03-22 DIAGNOSIS — G453 Amaurosis fugax: Secondary | ICD-10-CM

## 2019-03-22 DIAGNOSIS — Z888 Allergy status to other drugs, medicaments and biological substances status: Secondary | ICD-10-CM | POA: Diagnosis not present

## 2019-03-22 DIAGNOSIS — J449 Chronic obstructive pulmonary disease, unspecified: Secondary | ICD-10-CM | POA: Diagnosis present

## 2019-03-22 DIAGNOSIS — E079 Disorder of thyroid, unspecified: Secondary | ICD-10-CM | POA: Diagnosis present

## 2019-03-22 DIAGNOSIS — I6529 Occlusion and stenosis of unspecified carotid artery: Secondary | ICD-10-CM | POA: Diagnosis present

## 2019-03-22 DIAGNOSIS — Z8249 Family history of ischemic heart disease and other diseases of the circulatory system: Secondary | ICD-10-CM

## 2019-03-22 DIAGNOSIS — Z880 Allergy status to penicillin: Secondary | ICD-10-CM | POA: Diagnosis not present

## 2019-03-22 DIAGNOSIS — M810 Age-related osteoporosis without current pathological fracture: Secondary | ICD-10-CM | POA: Insufficient documentation

## 2019-03-22 DIAGNOSIS — I2510 Atherosclerotic heart disease of native coronary artery without angina pectoris: Secondary | ICD-10-CM | POA: Diagnosis present

## 2019-03-22 DIAGNOSIS — I6521 Occlusion and stenosis of right carotid artery: Secondary | ICD-10-CM | POA: Diagnosis not present

## 2019-03-22 DIAGNOSIS — F17210 Nicotine dependence, cigarettes, uncomplicated: Secondary | ICD-10-CM | POA: Diagnosis present

## 2019-03-22 DIAGNOSIS — I10 Essential (primary) hypertension: Secondary | ICD-10-CM | POA: Diagnosis present

## 2019-03-22 DIAGNOSIS — J387 Other diseases of larynx: Secondary | ICD-10-CM | POA: Diagnosis present

## 2019-03-22 DIAGNOSIS — R001 Bradycardia, unspecified: Secondary | ICD-10-CM | POA: Diagnosis not present

## 2019-03-22 HISTORY — PX: CAROTID PTA/STENT INTERVENTION: CATH118231

## 2019-03-22 LAB — MRSA PCR SCREENING: MRSA by PCR: NEGATIVE

## 2019-03-22 LAB — GLUCOSE, CAPILLARY: Glucose-Capillary: 121 mg/dL — ABNORMAL HIGH (ref 70–99)

## 2019-03-22 SURGERY — CAROTID PTA/STENT INTERVENTION
Anesthesia: Moderate Sedation | Laterality: Right

## 2019-03-22 MED ORDER — ONDANSETRON HCL 4 MG/2ML IJ SOLN
INTRAMUSCULAR | Status: AC
  Administered 2019-03-22: 4 mg via INTRAVENOUS
  Filled 2019-03-22: qty 2

## 2019-03-22 MED ORDER — LIDOCAINE HCL (PF) 1 % IJ SOLN
INTRAMUSCULAR | Status: AC
  Filled 2019-03-22: qty 30

## 2019-03-22 MED ORDER — PROMETHAZINE HCL 25 MG/ML IJ SOLN
INTRAMUSCULAR | Status: AC
  Filled 2019-03-22: qty 1

## 2019-03-22 MED ORDER — ASPIRIN 81 MG PO CHEW: 81.0000 mg | CHEWABLE_TABLET | Freq: Every day | ORAL | Status: DC

## 2019-03-22 MED ORDER — PHENOL 1.4 % MT LIQD
1.0000 | OROMUCOSAL | Status: DC | PRN
  Filled 2019-03-22: qty 177

## 2019-03-22 MED ORDER — ALBUTEROL SULFATE (2.5 MG/3ML) 0.083% IN NEBU: 3.0000 mL | INHALATION_SOLUTION | Freq: Four times a day (QID) | RESPIRATORY_TRACT | Status: DC | PRN

## 2019-03-22 MED ORDER — ESTRADIOL 0.5 MG PO TABS: 0.2500 mg | ORAL_TABLET | Freq: Every day | ORAL | Status: DC

## 2019-03-22 MED ORDER — DOPAMINE-DEXTROSE 3.2-5 MG/ML-% IV SOLN
INTRAVENOUS | Status: AC
  Filled 2019-03-22: qty 250

## 2019-03-22 MED ORDER — FLUTICASONE PROPIONATE 50 MCG/ACT NA SUSP
1.0000 | Freq: Every day | NASAL | Status: DC
  Filled 2019-03-22: qty 16

## 2019-03-22 MED ORDER — MIDAZOLAM HCL 5 MG/5ML IJ SOLN
INTRAMUSCULAR | Status: AC
  Filled 2019-03-22: qty 5

## 2019-03-22 MED ORDER — SODIUM CHLORIDE 0.9 % IV BOLUS
INTRAVENOUS | Status: AC | PRN
  Administered 2019-03-22: 300 mL via INTRAVENOUS

## 2019-03-22 MED ORDER — PHENYLEPHRINE HCL (PRESSORS) 10 MG/ML IV SOLN
INTRAVENOUS | Status: AC
  Filled 2019-03-22: qty 1

## 2019-03-22 MED ORDER — LEVOTHYROXINE SODIUM 75 MCG PO TABS
75.0000 ug | ORAL_TABLET | Freq: Every day | ORAL | Status: DC
  Administered 2019-03-23 – 2019-03-24 (×2): 75 ug via ORAL
  Filled 2019-03-22 (×2): qty 1

## 2019-03-22 MED ORDER — FENTANYL CITRATE (PF) 100 MCG/2ML IJ SOLN
INTRAMUSCULAR | Status: DC | PRN
  Administered 2019-03-22: 50 ug via INTRAVENOUS
  Administered 2019-03-22: 25 ug via INTRAVENOUS

## 2019-03-22 MED ORDER — GUAIFENESIN-DM 100-10 MG/5ML PO SYRP: 15.0000 mL | ORAL_SOLUTION | ORAL | Status: DC | PRN

## 2019-03-22 MED ORDER — DOCUSATE SODIUM 100 MG PO CAPS
100.0000 mg | ORAL_CAPSULE | Freq: Every day | ORAL | Status: DC
  Administered 2019-03-23 – 2019-03-24 (×2): 100 mg via ORAL
  Filled 2019-03-22 (×2): qty 1

## 2019-03-22 MED ORDER — ALUM & MAG HYDROXIDE-SIMETH 200-200-20 MG/5ML PO SUSP
15.0000 mL | ORAL | Status: DC | PRN
  Administered 2019-03-23: 30 mL via ORAL
  Filled 2019-03-22 (×2): qty 30

## 2019-03-22 MED ORDER — ONDANSETRON HCL 4 MG/2ML IJ SOLN
4.0000 mg | Freq: Four times a day (QID) | INTRAMUSCULAR | Status: DC | PRN
  Administered 2019-03-22 (×2): 4 mg via INTRAVENOUS

## 2019-03-22 MED ORDER — DEXTROMETHORPHAN-GUAIFENESIN 10-200 MG PO CAPS: 1.0000 | ORAL_CAPSULE | Freq: Every day | ORAL | Status: DC | PRN

## 2019-03-22 MED ORDER — IODIXANOL 320 MG/ML IV SOLN
INTRAVENOUS | Status: DC | PRN
  Administered 2019-03-22: 60 mL via INTRA_ARTERIAL

## 2019-03-22 MED ORDER — OXYCODONE HCL 5 MG PO TABS: 5.0000 mg | ORAL_TABLET | ORAL | Status: DC | PRN

## 2019-03-22 MED ORDER — METOPROLOL TARTRATE 5 MG/5ML IV SOLN: 2.0000 mg | INTRAVENOUS | Status: DC | PRN

## 2019-03-22 MED ORDER — IBUPROFEN 400 MG PO TABS: 400.0000 mg | ORAL_TABLET | Freq: Three times a day (TID) | ORAL | Status: DC | PRN

## 2019-03-22 MED ORDER — LIDOCAINE-EPINEPHRINE (PF) 1 %-1:200000 IJ SOLN
INTRAMUSCULAR | Status: AC
  Filled 2019-03-22: qty 30

## 2019-03-22 MED ORDER — KCL IN DEXTROSE-NACL 20-5-0.9 MEQ/L-%-% IV SOLN
INTRAVENOUS | Status: DC
  Administered 2019-03-22: 11:00:00 via INTRAVENOUS
  Filled 2019-03-22 (×6): qty 1000

## 2019-03-22 MED ORDER — MORPHINE SULFATE (PF) 4 MG/ML IV SOLN: 2.0000 mg | INTRAVENOUS | Status: DC | PRN

## 2019-03-22 MED ORDER — MEPERIDINE HCL 25 MG/ML IJ SOLN
25.0000 mg | Freq: Once | INTRAMUSCULAR | Status: DC
  Filled 2019-03-22: qty 1

## 2019-03-22 MED ORDER — DIPHENHYDRAMINE HCL 50 MG/ML IJ SOLN: 50.0000 mg | Freq: Once | INTRAMUSCULAR | Status: DC | PRN

## 2019-03-22 MED ORDER — HYDRALAZINE HCL 20 MG/ML IJ SOLN: 5.0000 mg | INTRAMUSCULAR | Status: DC | PRN

## 2019-03-22 MED ORDER — HEPARIN SODIUM (PORCINE) 1000 UNIT/ML IJ SOLN
INTRAMUSCULAR | Status: AC
  Filled 2019-03-22: qty 1

## 2019-03-22 MED ORDER — HEPARIN SODIUM (PORCINE) 1000 UNIT/ML IJ SOLN
INTRAMUSCULAR | Status: DC | PRN
  Administered 2019-03-22: 6000 [IU] via INTRAVENOUS
  Administered 2019-03-22: 3000 [IU] via INTRAVENOUS

## 2019-03-22 MED ORDER — METHYLPREDNISOLONE SODIUM SUCC 125 MG IJ SOLR: 125.0000 mg | Freq: Once | INTRAMUSCULAR | Status: DC | PRN

## 2019-03-22 MED ORDER — ACETAMINOPHEN 325 MG PO TABS
325.0000 mg | ORAL_TABLET | ORAL | Status: DC | PRN
  Administered 2019-03-23 – 2019-03-24 (×2): 650 mg via ORAL
  Filled 2019-03-22 (×2): qty 2

## 2019-03-22 MED ORDER — MIDAZOLAM HCL 2 MG/ML PO SYRP: 8.0000 mg | ORAL_SOLUTION | Freq: Once | ORAL | Status: DC | PRN

## 2019-03-22 MED ORDER — SODIUM CHLORIDE 0.9 % IV SOLN
INTRAVENOUS | Status: DC
  Administered 2019-03-22: 07:00:00 via INTRAVENOUS

## 2019-03-22 MED ORDER — FENTANYL CITRATE (PF) 100 MCG/2ML IJ SOLN
INTRAMUSCULAR | Status: AC
  Filled 2019-03-22: qty 2

## 2019-03-22 MED ORDER — SODIUM CHLORIDE 0.9 % IV SOLN: 500.0000 mL | Freq: Once | INTRAVENOUS | Status: DC | PRN

## 2019-03-22 MED ORDER — ATROPINE SULFATE 1 MG/10ML IJ SOSY
PREFILLED_SYRINGE | INTRAMUSCULAR | Status: AC
  Filled 2019-03-22: qty 10

## 2019-03-22 MED ORDER — ONDANSETRON HCL 4 MG/2ML IJ SOLN: 4.0000 mg | Freq: Four times a day (QID) | INTRAMUSCULAR | Status: DC | PRN

## 2019-03-22 MED ORDER — LABETALOL HCL 5 MG/ML IV SOLN: 10.0000 mg | INTRAVENOUS | Status: DC | PRN

## 2019-03-22 MED ORDER — ASPIRIN EC 81 MG PO TBEC
81.0000 mg | DELAYED_RELEASE_TABLET | Freq: Every day | ORAL | Status: DC
  Administered 2019-03-22 – 2019-03-24 (×3): 81 mg via ORAL
  Filled 2019-03-22 (×3): qty 1

## 2019-03-22 MED ORDER — PROMETHAZINE HCL 25 MG/ML IJ SOLN
25.0000 mg | Freq: Four times a day (QID) | INTRAMUSCULAR | Status: DC | PRN
  Administered 2019-03-22: 12.5 mg via INTRAVENOUS

## 2019-03-22 MED ORDER — FAMOTIDINE 20 MG PO TABS: 40.0000 mg | ORAL_TABLET | Freq: Once | ORAL | Status: DC | PRN

## 2019-03-22 MED ORDER — ORAL CARE MOUTH RINSE: 15.0000 mL | Freq: Two times a day (BID) | OROMUCOSAL | Status: DC

## 2019-03-22 MED ORDER — SIMVASTATIN 40 MG PO TABS
40.0000 mg | ORAL_TABLET | Freq: Every day | ORAL | Status: DC
  Administered 2019-03-22 – 2019-03-23 (×2): 40 mg via ORAL
  Filled 2019-03-22: qty 2
  Filled 2019-03-22 (×3): qty 1

## 2019-03-22 MED ORDER — CLINDAMYCIN PHOSPHATE 300 MG/50ML IV SOLN
INTRAVENOUS | Status: AC
  Filled 2019-03-22: qty 50

## 2019-03-22 MED ORDER — EZETIMIBE 10 MG PO TABS
10.0000 mg | ORAL_TABLET | Freq: Every day | ORAL | Status: DC
  Administered 2019-03-22: 10 mg via ORAL
  Filled 2019-03-22 (×3): qty 1

## 2019-03-22 MED ORDER — CLOPIDOGREL BISULFATE 75 MG PO TABS
75.0000 mg | ORAL_TABLET | Freq: Every day | ORAL | Status: DC
  Administered 2019-03-23 – 2019-03-24 (×2): 75 mg via ORAL
  Filled 2019-03-22 (×2): qty 1

## 2019-03-22 MED ORDER — MIDAZOLAM HCL 2 MG/2ML IJ SOLN
INTRAMUSCULAR | Status: DC | PRN
  Administered 2019-03-22: 0.5 mg via INTRAVENOUS
  Administered 2019-03-22: 2 mg via INTRAVENOUS

## 2019-03-22 MED ORDER — DOPAMINE-DEXTROSE 3.2-5 MG/ML-% IV SOLN
0.0000 ug/kg/min | INTRAVENOUS | Status: DC
  Administered 2019-03-22: 4 ug/kg/min via INTRAVENOUS
  Administered 2019-03-23: 6 ug/kg/min via INTRAVENOUS
  Filled 2019-03-22: qty 250

## 2019-03-22 MED ORDER — HYDROMORPHONE HCL 1 MG/ML IJ SOLN: 1.0000 mg | Freq: Once | INTRAMUSCULAR | Status: DC | PRN

## 2019-03-22 MED ORDER — PANTOPRAZOLE SODIUM 40 MG IV SOLR
40.0000 mg | Freq: Every day | INTRAVENOUS | Status: DC
  Administered 2019-03-22 – 2019-03-23 (×2): 40 mg via INTRAVENOUS
  Filled 2019-03-22: qty 40

## 2019-03-22 MED ORDER — ACETAMINOPHEN 325 MG RE SUPP
325.0000 mg | RECTAL | Status: DC | PRN
  Filled 2019-03-22: qty 2

## 2019-03-22 MED ORDER — CLOPIDOGREL BISULFATE 75 MG PO TABS
300.0000 mg | ORAL_TABLET | ORAL | Status: AC
  Administered 2019-03-22: 300 mg via ORAL
  Filled 2019-03-22: qty 4

## 2019-03-22 MED ORDER — ATROPINE SULFATE 1 MG/10ML IJ SOSY
PREFILLED_SYRINGE | INTRAMUSCULAR | Status: DC | PRN
  Administered 2019-03-22: 1 mg via INTRAVENOUS

## 2019-03-22 SURGICAL SUPPLY — 22 items
BALLN VTRAC 4.5X15X135 (BALLOONS) ×3
BALLOON VTRAC 4.5X15X135 (BALLOONS) IMPLANT
CATH ANGIO 5F 100CM .035 PIG (CATHETERS) ×2 IMPLANT
CATH BEACON 5 .035 100 H1 TIP (CATHETERS) ×2 IMPLANT
COVER PROBE U/S 5X48 (MISCELLANEOUS) ×2 IMPLANT
DEVICE EMBOSHIELD NAV6 4.0-7.0 (FILTER) ×2 IMPLANT
DEVICE PRESTO INFLATION (MISCELLANEOUS) ×3 IMPLANT
DEVICE STARCLOSE SE CLOSURE (Vascular Products) ×2 IMPLANT
DEVICE TORQUE .025-.038 (MISCELLANEOUS) ×4 IMPLANT
GLIDEWIRE ANGLED SS 035X260CM (WIRE) ×2 IMPLANT
KIT CAROTID MANIFOLD (MISCELLANEOUS) ×2 IMPLANT
NDL ENTRY 21GA 7CM ECHOTIP (NEEDLE) IMPLANT
NEEDLE ENTRY 21GA 7CM ECHOTIP (NEEDLE) ×3 IMPLANT
PACK ANGIOGRAPHY (CUSTOM PROCEDURE TRAY) ×2 IMPLANT
SET INTRO CAPELLA COAXIAL (SET/KITS/TRAYS/PACK) ×2 IMPLANT
SHEATH BRITE TIP 6FRX11 (SHEATH) ×2 IMPLANT
SHEATH SHUTTLE SELECT 6F (SHEATH) ×2 IMPLANT
STENT XACT CAR 9-7X30X136 (Permanent Stent) ×2 IMPLANT
SYR MEDRAD MARK 7 150ML (SYRINGE) ×2 IMPLANT
TUBING CONTRAST HIGH PRESS 72 (TUBING) ×2 IMPLANT
WIRE G VAS 035X260 STIFF (WIRE) ×2 IMPLANT
WIRE J 3MM .035X145CM (WIRE) ×2 IMPLANT

## 2019-03-22 NOTE — Telephone Encounter (Signed)
Done   Thanks tMS

## 2019-03-22 NOTE — H&P (Signed)
[facility] History & Physical Update  The patient was interviewed and re-examined.  The patient's previous History and Physical has been reviewed and is unchanged.  There is no change in the plan of care. We plan to proceed with the scheduled procedure.  Hortencia Pilar, MD  03/22/2019, 7:59 AM

## 2019-03-22 NOTE — Progress Notes (Signed)
PAD replaced with 30cc of air. Minor amount of oozing noted. Femoral, tibial, pedal pulses all palpable. Will continue to monitor.

## 2019-03-22 NOTE — Progress Notes (Signed)
Pt arrived on unit at this time to RM # 17. Pt is drowsy but answers questions appropriately. Pt is hallucinating some she says, Phenergan was administered before pt coming to unit. Dopamine gtt is infusing, BP stable at this time. Pt is bradycardic with HR in the 50s. PAD assessed.

## 2019-03-22 NOTE — Progress Notes (Signed)
MRN : [id number]  Nicole Herman is a 63 y.o. ([birth year]) female who presents with chief complaint of  Chief Complaint  Patient presents with  . Follow-up    questions about procedure  .  History of Present Illness:   The patient is seen for follow up evaluation of carotid stenosis.  It has been one month since her last visit and she is now ready for addressing her carotid disease but she has questions she would like answered.  She is status post CT with contrast. CT scan was done5/14/2020 for evaluation of a neck mass. Patient reports that the test went well with no problems or complications.   The patient describes several episodes of  amaurosis fugax, these occurred in April and May of this year. There is no recent or interval TIA symptoms or focal motor deficits since June 2020. There is no prior documented CVA.  The patient is taking enteric-coated aspirin 81 mg daily.  There is no history of migraine headaches. There is no history of seizures.  The patient has a history of coronary artery disease, no recent episodes of angina or shortness of breath. The patient denies PAD or claudication symptoms. There is a history of hyperlipidemia which is being treated with a statin.   CT scan is reviewed by me personally and shows90%stenosis consistent with calcified plaque at the origin of the rightinternal carotid artery.   This correlated with my office ultrasound which is worse compared to the last scan and now is 80-99% stenosis of the right carotid  Current Meds  Medication Sig  . albuterol (VENTOLIN HFA) 108 (90 Base) MCG/ACT inhaler Inhale 2 puffs into the lungs every 6 (six) hours as needed for wheezing or shortness of breath. AND COUGH (Patient taking differently: Inhale 2 puffs into the lungs every 6 (six) hours as needed (wheezing/shortness of breath/cough.). )  . aspirin 81 MG chewable tablet Chew 81 mg by mouth daily.   . Calcium-Magnesium-Vitamin D (CALCIUM  1200+D3 PO) Take 1 tablet by mouth 2 (two) times a week.  Marland Kitchen Dextromethorphan-guaiFENesin (CORICIDIN HBP CONGESTION/COUGH) 10-200 MG CAPS Take 1-2 tablets by mouth daily as needed (for cough/congestion.).  Marland Kitchen diclofenac sodium (VOLTAREN) 1 % GEL Apply 4 g topically 2 (two) times daily as needed (neck pain.).   Marland Kitchen escitalopram (LEXAPRO) 10 MG tablet Take 1 tablet (10 mg total) by mouth daily. FOR GAD  . estradiol (ESTRACE) 0.5 MG tablet Take 1/2  Tab  By po daily (Patient taking differently: Take 0.25 mg by mouth daily. )  . ezetimibe (ZETIA) 10 MG tablet Take 1 tablet (10 mg total) by mouth daily. (Patient not taking: Reported on 03/22/2019)  . fluticasone (FLONASE) 50 MCG/ACT nasal spray Place 1 spray into both nostrils daily.  Marland Kitchen ibuprofen (ADVIL) 200 MG tablet Take 400 mg by mouth every 8 (eight) hours as needed (pain.).  Marland Kitchen levothyroxine (SYNTHROID) 75 MCG tablet TAKE 1 TABLET(75 MCG) BY MOUTH DAILY BEFORE BREAKFAST (Patient taking differently: Take 75 mcg by mouth daily before breakfast. )  . ondansetron (ZOFRAN ODT) 4 MG disintegrating tablet Take 1 tablet (4 mg total) by mouth every 8 (eight) hours as needed.  . pantoprazole (PROTONIX) 40 MG tablet TAKE 1 TABLET(40 MG) BY MOUTH TWICE DAILY (Patient taking differently: Take 40 mg by mouth 2 (two) times a day. )  . simvastatin (ZOCOR) 40 MG tablet Take 1 tablet (40 mg total) by mouth at bedtime.  . [DISCONTINUED] telmisartan (MICARDIS) 40 MG tablet Take 1/2  tab po daily (Patient taking differently: Take 20 mg by mouth daily. Take 1/2 tab po daily)    Past Medical History:  Diagnosis Date  . Airway polyps 2017  . Coronary artery disease    s/p carotid surgery left   . Does use hearing aid   . Fibromyalgia   . GERD (gastroesophageal reflux disease)   . Hypertension   . Thyroid disease     Past Surgical History:  Procedure Laterality Date  . ABDOMINAL HYSTERECTOMY    . CAROTID ENDARTERECTOMY     left   . STAPEDES SURGERY     otosclerosis  right   . TRIGGER FINGER RELEASE      Social History Social History   Tobacco Use  . Smoking status: Current Every Day Smoker    Packs/day: 1.00    Types: Cigarettes  . Smokeless tobacco: Never Used  Substance Use Topics  . Alcohol use: No  . Drug use: No    Family History Family History  Problem Relation Age of Onset  . Hypertension Mother   . Dementia Mother   . Cancer Father        colon   . Learning disabilities Brother   . Colon polyps Brother   . Heart attack Maternal Grandmother   . Colon polyps Sister     Allergies  Allergen Reactions  . Chantix [Varenicline Tartrate]     Nightmares    . Wellbutrin [Bupropion]     Did not like the way it made her feel: "too wired."   . Penicillins Rash    Did it involve swelling of the face/tongue/throat, SOB, or low BP? No Did it involve sudden or severe rash/hives, skin peeling, or any reaction on the inside of your mouth or nose? Yes Did you need to seek medical attention at a hospital or doctor's office? Yes When did it last happen?3-4 years ago If all above answers are "NO", may proceed with cephalosporin use.      REVIEW OF SYSTEMS (Negative unless checked)  Constitutional: [] Weight loss  [] Fever  [] Chills Cardiac: [] Chest pain   [] Chest pressure   [] Palpitations   [] Shortness of breath when laying flat   [] Shortness of breath with exertion. Vascular:  [] Pain in legs with walking   [] Pain in legs at rest  [] History of DVT   [] Phlebitis   [] Swelling in legs   [] Varicose veins   [] Non-healing ulcers Pulmonary:   [] Uses home oxygen   [] Productive cough   [] Hemoptysis   [] Wheeze  [] COPD   [] Asthma Neurologic:  [] Dizziness   [] Seizures   [] History of stroke   [] History of TIA  [] Aphasia   [x] Vissual changes   [] Weakness or numbness in arm   [] Weakness or numbness in leg Musculoskeletal:   [] Joint swelling   [x] Joint pain   [] Low back pain Hematologic:  [] Easy bruising  [] Easy bleeding   [] Hypercoagulable state    [] Anemic Gastrointestinal:  [] Diarrhea   [] Vomiting  [] Gastroesophageal reflux/heartburn   [] Difficulty swallowing. Genitourinary:  [] Chronic kidney disease   [] Difficult urination  [] Frequent urination   [] Blood in urine Skin:  [] Rashes   [] Ulcers  Psychological:  [] History of anxiety   []  History of major depression.  Physical Examination  Vitals:   03/20/19 1331  BP: 138/76  Pulse: 64  Resp: 16  Weight: 137 lb (62.1 kg)   Body mass index is 23.52 kg/m. Gen: WD/WN, NAD Head: [place]/AT, No temporalis wasting.  Ear/Nose/Throat: Hearing grossly intact, nares w/o erythema or drainage  Eyes: PER, EOMI, sclera nonicteric.  Neck: Supple, no large masses.   Pulmonary:  Good air movement, no audible wheezing bilaterally, no use of accessory muscles.  Cardiac: RRR, no JVD Vascular:  Right carotid bruit Vessel Right Left  Radial Palpable Palpable  Brachial Palpable Palpable  Carotid Palpable Palpable  Gastrointestinal: Non-distended. No guarding/no peritoneal signs.  Musculoskeletal: M/S 5/5 throughout.  No deformity or atrophy.  Neurologic: CN 2-12 intact. Symmetrical.  Speech is fluent. Motor exam as listed above. Psychiatric: Judgment intact, Mood & affect appropriate for pt's clinical situation. Dermatologic: No rashes or ulcers noted.  No changes consistent with cellulitis. Lymph : No lichenification or skin changes of chronic lymphedema.  CBC Lab Results  Component Value Date   WBC 6.1 03/10/2019   HGB 12.9 03/10/2019   HCT 39.3 03/10/2019   MCV 89.0 03/10/2019   PLT 177.0 03/10/2019    BMET    Component Value Date/Time   NA 141 03/10/2019 0916   NA 139 09/27/2018 0922   NA 139 12/14/2011 0731   K 3.9 03/10/2019 0916   K 4.5 12/14/2011 0731   CL 106 03/10/2019 0916   CL 103 12/14/2011 0731   CO2 27 03/10/2019 0916   CO2 30 12/14/2011 0731   GLUCOSE 109 (H) 03/10/2019 0916   GLUCOSE 126 (H) 12/14/2011 0731   BUN 21 03/17/2019 1031   BUN 15 09/27/2018 0922   BUN 14  12/14/2011 0731   CREATININE 0.89 03/17/2019 1031   CREATININE 1.03 12/14/2011 0731   CALCIUM 9.2 03/10/2019 0916   CALCIUM 9.1 12/14/2011 0731   GFRNONAA >60 03/17/2019 1031   GFRNONAA 59 (L) 12/14/2011 0731   GFRAA >60 03/17/2019 1031   GFRAA >60 12/14/2011 0731   Estimated Creatinine Clearance: 55.9 mL/min (by C-G formula based on SCr of 0.89 mg/dL).  COAG No results found for: INR, PROTIME  Radiology No results found.   Assessment/Plan 1. Symptomatic stenosis of right carotid artery without infarction Recommend:  The patient is symptomatic with respect to the carotid stenosis.  The patient now has progressed and has a lesion the is >90%.  Patient's CT angiography of the carotid arteries confirms >90% right ICA stenosis.  The anatomical considerations support stenting over surgery.  This was discussed in detail with the patient.  The risks, benefits and alternative therapies were reviewed in detail with the patient.  All questions were answered.  The patient agrees to proceed with stenting of the right carotid artery.  Continue antiplatelet therapy as prescribed. Continue management of CAD, HTN and Hyperlipidemia. Healthy heart diet, encouraged exercise at least 4 times per week.    A total of 30 minutes was spent with this patient and greater than 50% was spent in counseling and coordination of care with the patient.  Discussion included the treatment options for vascular disease including indications for surgery and intervention.  Also discussed is the appropriate timing of treatment.  In addition medical therapy was discussed.  2. Chronic obstructive pulmonary disease, unspecified COPD type ([place]) Continue pulmonary medications and aerosols as already ordered, these medications have been reviewed and there are no changes at this time.  3. Mixed hyperlipidemia Continue statin as ordered and reviewed, no changes at this time   4. Essential hypertension Continue  antihypertensive medications as already ordered, these medications have been reviewed and there are no changes at this time.     Hortencia Pilar, MD  03/22/2019 8:00 AM

## 2019-03-22 NOTE — Progress Notes (Signed)
Spoke to Dr. Lucky Cowboy about pt's PAD still bleeding (level 2) even after holding pressure. He thinks that it is residual from heparin during procedure. He wants pt to continue to lay as flat as possible. No other orders received.

## 2019-03-22 NOTE — Progress Notes (Signed)
Called and spoke with Dr. Delana Meyer re: pt. Increasing nausea despite zofran, BP 99/50 on 40mcg/kg/min Dopamine and HR in 50's. MD states "I prefer to leave her on the dopamine." New order received for Phenergan IV now. No other neuro symptoms at present.

## 2019-03-22 NOTE — Op Note (Signed)
OPERATIVE NOTE DATE: 03/22/2019  PROCEDURE: 1.  Ultrasound guidance for vascular access right femoral artery 2.  Placement of a 9 mm x 7 mm x 30 mm exact stent with the use of the NAV-6 embolic protection device in the right carotid artery  PRE-OPERATIVE DIAGNOSIS: 1.  Greater than 80% stenosis of the right internal carotid artery. 2.  Amaurosis fugax  POST-OPERATIVE DIAGNOSIS:  Same as above  SURGEON: Hortencia Pilar  ASSISTANT(S): Erskine Squibb dew, MD  ANESTHESIA: local/MCS  ESTIMATED BLOOD LOSS: 100 cc  CONTRAST: 70 cc  FLUORO TIME: 14 minutes  MODERATE CONSCIOUS SEDATION TIME: Continuous ECG pulse oximetry and cardiopulmonary monitoring was performed throughout the entire procedure by the interventional radiology nurse total sedation time was 55 minutes  FINDING(S): 1.   Greater than 80% right carotid artery stenosis  SPECIMEN(S):   none  INDICATIONS:   Patient is a 63 y.o. female who presents with multiple episodes of right amaurosis fugax in association with greater than 80% right carotid artery stenosis.  The patient has advanced COPD as well as laryngeal polyps that make intubation significantly more complex and carotid artery stenting was felt to be preferred to endarterectomy for that reason.  Risks and benefits were discussed and informed consent was obtained.   DESCRIPTION: After obtaining full informed written consent, the patient was brought back to the vascular suite and placed supine upon the table.  The patient received IV antibiotics prior to induction. Moderate conscious sedation was administered during a face to face encounter with the patient throughout the procedure with my supervision of the RN administering medicines and monitoring the patients vital signs and mental status throughout from the start of the procedure until the patient was taken to the recovery room.    After obtaining adequate sedation, the patient was prepped and draped in the standard  fashion.    A first assistant is required in order to allow for a safe and more efficient operation.  Duties include wire manipulations as well as assistance with pinning the sheath and positioning the detector for proper angle, assistance and deploying the stent in the proper position and appropriate images.  Further duties include assisting with patient positioning during the procedure.  I believe that this procedure requires a first assistant in order for it to be performed at a level in keeping with the high standards of this institution.  The right femoral artery was visualized with ultrasound and found to be widely patent. It was then accessed under direct ultrasound guidance without difficulty with a micropuncture needle. A permanent image was recorded.  A microwire was then advanced without difficulty under fluoroscopic guidance followed by a micro-sheath.  A J-wire was placed and we then placed a 6 French sheath. The patient was then heparinized and a total of 9000 units of intravenous heparin were given and an ACT was checked to confirm successful anticoagulation.   A pigtail catheter was then placed into the ascending aorta. This showed a type I arch no evidence of hemodynamically significant ostial stenosis of the great vessels. The right common carotid artery was then selectively cannulated without difficulty with a H1 catheter and the catheter advanced into the mid right common carotid artery.  Cervical and cerebral carotid angiography was then performed. There were no obvious intracranial filling defects. The carotid bifurcation demonstrated greater than 80% stenosis at the origin of the right internal carotid artery extending for 10-15 mm in length.  I then advanced into the external carotid artery  with a Glidewire and the H1 catheter and then exchanged for the Amplatz Super Stiff wire. Over the Amplatz Super Stiff wire, a 6 [place] shuttle sheath was placed into the mid common carotid artery. I  then used the NAV-6  Embolic protection device and crossed the lesion and parked this in the distal internal carotid artery at the base of the skull.  I then selected a 9 mm x 7 mm x 30 mm exact stent. This was deployed across the lesion encompassing it in its entirety. A 4.5 mm x 15 mm length balloon was used to post dilate the stent. Only about a 20 % residual stenosis was present after angioplasty. Completion angiogram showed normal intracranial filling without new defects. At this point I elected to terminate the procedure. The sheath was removed and StarClose closure device was deployed in the right femoral artery with excellent hemostatic result. The patient was taken to the recovery room in stable condition having tolerated the procedure well.  COMPLICATIONS: none  CONDITION: stable  Hortencia Pilar 03/22/2019 10:08 AM   This note was created with Dragon Medical transcription system. Any errors in dictation are purely unintentional.

## 2019-03-22 NOTE — Progress Notes (Signed)
Dr. Ronalee Belts removed PAD and placed stitch with epi to stop bleeding. Guaze placed over site and no bleeding observed at this time.

## 2019-03-22 NOTE — Progress Notes (Signed)
Spoke with Dr. Lucky Cowboy regarding hematoma and bladder pressure. Dr. Lucky Cowboy gave verbal order to replace PAD to help with hemostasis and prevent hematoma from getting larger overnight. Will continue to monitor for any further bleeding and to ensure that pt continues to void.

## 2019-03-22 NOTE — Progress Notes (Signed)
Pt c/o pressure around bladder. No bleeding noted to right femoral access, however there is a palpable hematoma approximately 3cm to the right of the access site, and pain with palpation over the bladder. Dr. Lucky Cowboy paged since pt had bleeding from access site that required intervention from Dr. Delana Meyer earlier in the day, awaiting page back.

## 2019-03-22 NOTE — Telephone Encounter (Signed)
Insurance verification for Prolia filed on [employer] Portal. 

## 2019-03-23 ENCOUNTER — Encounter: Payer: Self-pay | Admitting: Vascular Surgery

## 2019-03-23 DIAGNOSIS — I6521 Occlusion and stenosis of right carotid artery: Principal | ICD-10-CM

## 2019-03-23 DIAGNOSIS — Z9889 Other specified postprocedural states: Secondary | ICD-10-CM

## 2019-03-23 DIAGNOSIS — G453 Amaurosis fugax: Secondary | ICD-10-CM

## 2019-03-23 LAB — URINALYSIS, COMPLETE (UACMP) WITH MICROSCOPIC
Bilirubin Urine: NEGATIVE
Glucose, UA: NEGATIVE mg/dL
Hgb urine dipstick: NEGATIVE
Ketones, ur: NEGATIVE mg/dL
Leukocytes,Ua: NEGATIVE
Nitrite: NEGATIVE
Protein, ur: NEGATIVE mg/dL
Specific Gravity, Urine: 1.008 (ref 1.005–1.030)
pH: 6 (ref 5.0–8.0)

## 2019-03-23 LAB — BASIC METABOLIC PANEL
Anion gap: 6 (ref 5–15)
BUN: 11 mg/dL (ref 8–23)
CO2: 24 mmol/L (ref 22–32)
Calcium: 8.8 mg/dL — ABNORMAL LOW (ref 8.9–10.3)
Chloride: 110 mmol/L (ref 98–111)
Creatinine, Ser: 0.73 mg/dL (ref 0.44–1.00)
GFR calc Af Amer: >60 mL/min (ref >60)
GFR calc non Af Amer: >60 mL/min (ref >60)
Glucose, Bld: 167 mg/dL — ABNORMAL HIGH (ref 70–99)
Potassium: 3.5 mmol/L (ref 3.5–5.1)
Sodium: 140 mmol/L (ref 135–145)

## 2019-03-23 LAB — CBC
HCT: 35.1 % — ABNORMAL LOW (ref 36.0–46.0)
Hemoglobin: 11.2 g/dL — ABNORMAL LOW (ref 12.0–15.0)
MCH: 28.9 pg (ref 26.0–34.0)
MCHC: 31.9 g/dL (ref 30.0–36.0)
MCV: 90.5 fL (ref 80.0–100.0)
Platelets: 191 10*3/uL (ref 150–400)
RBC: 3.88 MIL/uL (ref 3.87–5.11)
RDW: 14.3 % (ref 11.5–15.5)
WBC: 8.9 10*3/uL (ref 4.0–10.5)
nRBC: 0 % (ref 0.0–0.2)

## 2019-03-23 MED ORDER — PNEUMOCOCCAL VAC POLYVALENT 25 MCG/0.5ML IJ INJ
0.5000 mL | INJECTION | INTRAMUSCULAR | Status: AC
  Administered 2019-03-24: 0.5 mL via INTRAMUSCULAR
  Filled 2019-03-23: qty 0.5

## 2019-03-23 MED ORDER — PHENTOLAMINE MESYLATE 5 MG IJ SOLR
5.0000 mg | Freq: Once | INTRAMUSCULAR | Status: AC
  Administered 2019-03-23: 5 mg via SUBCUTANEOUS
  Filled 2019-03-23: qty 5

## 2019-03-23 MED ORDER — SODIUM CHLORIDE 0.9 % IV BOLUS
1000.0000 mL | Freq: Once | INTRAVENOUS | Status: AC
  Administered 2019-03-23: 1000 mL via INTRAVENOUS

## 2019-03-23 MED ORDER — LIDOCAINE 5 % EX PTCH
1.0000 | MEDICATED_PATCH | CUTANEOUS | Status: DC
  Administered 2019-03-23: 1 via TRANSDERMAL
  Filled 2019-03-23 (×2): qty 1

## 2019-03-23 MED ORDER — LACTATED RINGERS IV SOLN
INTRAVENOUS | Status: DC
  Administered 2019-03-23 (×2): via INTRAVENOUS

## 2019-03-23 MED ORDER — CHLORHEXIDINE GLUCONATE CLOTH 2 % EX PADS
6.0000 | MEDICATED_PAD | Freq: Every day | CUTANEOUS | Status: DC
  Administered 2019-03-23: 6 via TOPICAL

## 2019-03-23 NOTE — Consult Note (Signed)
Reason for Consult: Bradycardia hypotension postop carotid stent Referring Physician: Dr. Ella Jubilee vascular.  Primary Dr. Kathryne Gin Nicole Herman is an 63 y.o. female.  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  Denies any chest pain no blackout spells or syncope appears to be improving in ICU with fluids and low-dose pressors.  No oncology involvement for therapy to help with symptom improvement  Past Medical History:  Diagnosis Date  . Airway polyps 2017  . Coronary artery disease    s/p carotid surgery left   . Does use hearing aid   . Fibromyalgia   . GERD (gastroesophageal reflux disease)   . Hypertension   . Thyroid disease     Past Surgical History:  Procedure Laterality Date  . ABDOMINAL HYSTERECTOMY    . CAROTID ENDARTERECTOMY     left   . CAROTID PTA/STENT INTERVENTION Right 03/22/2019   Procedure: CAROTID PTA/STENT INTERVENTION;  Surgeon: Katha Cabal, MD;  Location: [facility];  Service: Cardiovascular;  Laterality: Right;  . STAPEDES SURGERY     otosclerosis right   . TRIGGER FINGER RELEASE      Family History  Problem Relation Age of Onset  . Hypertension Mother   . Dementia Mother   . Cancer Father        colon   . Learning disabilities Brother   . Colon polyps Brother   . Heart attack Maternal Grandmother   . Colon polyps Sister     Social History:  reports that she has been smoking cigarettes. She has been smoking about 1.00 pack per day. She has never used smokeless tobacco. She reports that she does not drink alcohol or use drugs.  Allergies:  Allergies  Allergen Reactions  . Chantix [Varenicline Tartrate]    Nightmares    . Wellbutrin [Bupropion]     Did not like the way it made her feel: "too wired."   . Penicillins Rash    Did it involve swelling of the face/tongue/throat, SOB, or low BP? No Did it involve sudden or severe rash/hives, skin peeling, or any reaction on the inside of your mouth or nose? Yes Did you need to seek medical attention at a hospital or doctor's office? Yes When did it last happen?3-4 years ago If all above answers are "NO", may proceed with cephalosporin use.     Medications: I have reviewed the patient's current medications.  Results for orders placed or performed during the hospital encounter of 03/22/19 (from the past 48 hour(s))  Glucose, capillary     Status: Abnormal   Collection Time: 03/22/19 12:01 PM  Result Value Ref Range   Glucose-Capillary 121 (H) 70 - 99 mg/dL  MRSA PCR Screening     Status: None   Collection Time: 03/22/19 12:11 PM   Specimen: Nasal Mucosa; Nasopharyngeal  Result Value Ref Range   MRSA by PCR NEGATIVE NEGATIVE    Comment:        The GeneXpert MRSA Assay (FDA approved for NASAL specimens only), is one component of a comprehensive MRSA colonization surveillance program. It is not intended to diagnose MRSA infection nor to guide or monitor treatment for MRSA infections. Performed at [facility], [address] [place], [place] [address]   CBC  Status: Abnormal   Collection Time: 03/23/19  4:35 AM  Result Value Ref Range   WBC 8.9 4.0 - 10.5 K/uL   RBC 3.88 3.87 - 5.11 MIL/uL   Hemoglobin 11.2 (L) 12.0 - 15.0 g/dL   HCT 35.1 (L) 36.0 - 46.0 %   MCV 90.5 80.0 - 100.0 fL   MCH 28.9 26.0 - 34.0 pg   MCHC 31.9 30.0 - 36.0 g/dL   RDW 14.3 11.5 - 15.5 %   Platelets 191 150 - 400 K/uL   nRBC 0.0 0.0 - 0.2 %    Comment: Performed at [facility], [address] [place], [place] [address]  Basic metabolic panel     Status: Abnormal   Collection Time: 03/23/19  4:35 AM  Result Value Ref Range    Sodium 140 135 - 145 mmol/L   Potassium 3.5 3.5 - 5.1 mmol/L   Chloride 110 98 - 111 mmol/L   CO2 24 22 - 32 mmol/L   Glucose, Bld 167 (H) 70 - 99 mg/dL   BUN 11 8 - 23 mg/dL   Creatinine, Ser 0.73 0.44 - 1.00 mg/dL   Calcium 8.8 (L) 8.9 - 10.3 mg/dL   GFR calc non Af Amer >60 >60 mL/min   GFR calc Af Amer >60 >60 mL/min   Anion gap 6 5 - 15    Comment: Performed at [facility], [address] [place], [place] [address]  Urinalysis, Complete w Microscopic     Status: Abnormal   Collection Time: 03/23/19  8:51 AM  Result Value Ref Range   Color, Urine STRAW (A) YELLOW   APPearance CLEAR (A) CLEAR   Specific Gravity, Urine 1.008 1.005 - 1.030   pH 6.0 5.0 - 8.0   Glucose, UA NEGATIVE NEGATIVE mg/dL   Hgb urine dipstick NEGATIVE NEGATIVE   Bilirubin Urine NEGATIVE NEGATIVE   Ketones, ur NEGATIVE NEGATIVE mg/dL   Protein, ur NEGATIVE NEGATIVE mg/dL   Nitrite NEGATIVE NEGATIVE   Leukocytes,Ua NEGATIVE NEGATIVE   RBC / HPF 0-5 0 - 5 RBC/hpf   WBC, UA 0-5 0 - 5 WBC/hpf   Bacteria, UA MANY (A) NONE SEEN   Squamous Epithelial / LPF 0-5 0 - 5   Mucus PRESENT     Comment: Performed at [facility], [address] [place], [place] [address]    No results found.  Review of Systems  Constitutional: Positive for malaise/fatigue.  HENT: Positive for sore throat.   Eyes: Negative.   Respiratory: Negative.   Cardiovascular: Negative.   Gastrointestinal: Positive for heartburn.  Genitourinary: Negative.   Musculoskeletal: Negative.   Skin: Negative.   Neurological: Positive for dizziness.  Endo/Heme/Allergies: Negative.   Psychiatric/Behavioral: Negative.    Blood pressure (!) 105/56, pulse (!) 42, temperature 97.8 F (36.6 C), temperature source Axillary, resp. rate 18, height 5\' 4"  (1.626 m), weight 66.5 kg, SpO2 97 %. Physical Exam  Nursing note and vitals reviewed. Constitutional: She is oriented to person, place, and time. She appears well-developed and  well-nourished.  HENT:  Head: Normocephalic and atraumatic.  Eyes: Pupils are equal, round, and reactive to light. Conjunctivae and EOM are normal.  Neck: Normal range of motion. Neck supple.  Cardiovascular: Normal rate, regular rhythm and normal heart sounds.  Respiratory: Effort normal and breath sounds normal.  GI: Soft.  Musculoskeletal: Normal range of motion.  Neurological: She is alert and oriented to person, place, and time. She has normal reflexes.  Skin: Skin is warm and  dry.  Psychiatric: She has a normal mood and affect.    Assessment/Plan: Bradycardia Hypotension Status post carotid artery stenting on the right Peripheral vascular disease TIA/CVA Coronary artery disease Hyperlipidemia Depression GERD Thyroid disease COPD Smoking Laryngeal nodule . Plan Agree with ICU level care Recommend aggressive fluid resuscitation Continue pressor therapy wean when able Will tolerate systolic blood pressure greater than 100 Heart rate greater than 50 No indication for permanent pacemaker at this point Continue lipid management Agree  with reflux therapy Conservative medical management Gradually increase activity Advised patient to refrain from tobacco abuse    D  03/23/2019, 5:01 PM

## 2019-03-23 NOTE — Progress Notes (Signed)
[facility] Daily Progress Note   Subjective: 1 Day Post-Op: 1.  Ultrasound guidance for vascular access right femoral artery 2.  Placement of a 9 mm x 7 mm x 30 mm exact stent with the use of the NAV-6 embolic protection device in the right carotid artery  Without complaint this AM with the exception of some nausea after eating.  Patient on dopamine for some pressure support overnight and into this AM.  Patient notes some "burning" with urination however UA is negative.  Objective: Vitals:   03/23/19 0630 03/23/19 0700 03/23/19 0800 03/23/19 0900  BP: (!) 118/39 (!) 140/45 (!) 126/47 (!) 95/48  Pulse: (!) 47 (!) 49 (!) 41 (!) 57  Resp: 14 17 16 15   Temp:   (!) 97.3 F (36.3 C)   TempSrc:   Axillary   SpO2: 97% 97% 98% 95%  Weight:      Height:        Intake/Output Summary (Last 24 hours) at 03/23/2019 1052 Last data filed at 03/23/2019 0900 Gross per 24 hour  Intake 1047.01 ml  Output 151 ml  Net 896.01 ml   Physical Exam: A&Ox3, NAD Face: Symmetrical Neck: Trachea midline.  No swelling or ecchymosis noted. CV: RRR Pulmonary: CTA Bilaterally Abdomen: Soft, Nontender, Nondistended Right Groin: PAD in place.  No drainage or ecchymosis noted.  Possibly a small hematoma to the lateral aspect of the thigh -no ecchymosis or swelling noted. Vascular: Thigh soft, calf soft.  Extremities warm distally to toes.  Nontender to palpation.   Laboratory: CBC    Component Value Date/Time   WBC 8.9 03/23/2019 0435   HGB 11.2 (L) 03/23/2019 0435   HGB 15.4 09/01/2017 0921   HCT 35.1 (L) 03/23/2019 0435   HCT 46.0 09/01/2017 0921   PLT 191 03/23/2019 0435   PLT 202 09/01/2017 0921   BMET    Component Value Date/Time   NA 140 03/23/2019 0435   NA 139 09/27/2018 0922   NA 139 12/14/2011 0731   K 3.5 03/23/2019 0435   K 4.5 12/14/2011 0731   CL 110 03/23/2019 0435   CL 103 12/14/2011 0731   CO2 24 03/23/2019 0435   CO2 30 12/14/2011 0731   GLUCOSE 167 (H)  03/23/2019 0435   GLUCOSE 126 (H) 12/14/2011 0731   BUN 11 03/23/2019 0435   BUN 15 09/27/2018 0922   BUN 14 12/14/2011 0731   CREATININE 0.73 03/23/2019 0435   CREATININE 1.03 12/14/2011 0731   CALCIUM 8.8 (L) 03/23/2019 0435   CALCIUM 9.1 12/14/2011 0731   GFRNONAA >60 03/23/2019 0435   GFRNONAA 59 (L) 12/14/2011 0731   GFRAA >60 03/23/2019 0435   GFRAA >60 12/14/2011 0731   Assessment/Planning: The patient is a 63 year old female status post a right carotid artery stent placement postop day 1 1) patient was started on dopamine drip yesterday evening for some gentle pressure support.  Bradycardic however asymptomatic at this time.  Will consult Dr. Sheron Nightingale for recommendations. 2) no UTI on urine analysis 3) can start to remove PAD 4) wean from pressors 5) OOB to chair  Discussed with Dr. Eber Hong Jarmar Rousseau PA-C 03/23/2019 10:52 AM

## 2019-03-23 NOTE — Plan of Care (Signed)

## 2019-03-23 NOTE — Progress Notes (Signed)
Shift summary: - Dopamine weaned from 8 mcg to 6 mcg - Dopamine infusion to RFA, PIV extravasated. Phentolamine injected around area. - Remains symptomatic of bradycardia and hypotension upon movement (dizziness/nausea/vision changes). - PAD off of right groin site at 1400 hrs today.  - Cards consulted today.

## 2019-03-24 LAB — CBC
HCT: 33.4 % — ABNORMAL LOW (ref 36.0–46.0)
Hemoglobin: 10.9 g/dL — ABNORMAL LOW (ref 12.0–15.0)
MCH: 28.8 pg (ref 26.0–34.0)
MCHC: 32.6 g/dL (ref 30.0–36.0)
MCV: 88.4 fL (ref 80.0–100.0)
Platelets: 160 10*3/uL (ref 150–400)
RBC: 3.78 MIL/uL — ABNORMAL LOW (ref 3.87–5.11)
RDW: 14 % (ref 11.5–15.5)
WBC: 7 10*3/uL (ref 4.0–10.5)
nRBC: 0 % (ref 0.0–0.2)

## 2019-03-24 LAB — BASIC METABOLIC PANEL
Anion gap: 3 — ABNORMAL LOW (ref 5–15)
BUN: 10 mg/dL (ref 8–23)
CO2: 28 mmol/L (ref 22–32)
Calcium: 8.6 mg/dL — ABNORMAL LOW (ref 8.9–10.3)
Chloride: 111 mmol/L (ref 98–111)
Creatinine, Ser: 0.78 mg/dL (ref 0.44–1.00)
GFR calc Af Amer: >60 mL/min (ref >60)
GFR calc non Af Amer: >60 mL/min (ref >60)
Glucose, Bld: 110 mg/dL — ABNORMAL HIGH (ref 70–99)
Potassium: 3.9 mmol/L (ref 3.5–5.1)
Sodium: 142 mmol/L (ref 135–145)

## 2019-03-24 LAB — URINE CULTURE
Culture: NO GROWTH
Special Requests: NORMAL

## 2019-03-24 LAB — TSH: TSH: 0.288 u[IU]/mL — ABNORMAL LOW (ref 0.350–4.500)

## 2019-03-24 LAB — MAGNESIUM: Magnesium: 2.1 mg/dL (ref 1.7–2.4)

## 2019-03-24 MED ORDER — CLOPIDOGREL BISULFATE 75 MG PO TABS: 75.0000 mg | ORAL_TABLET | Freq: Every day | ORAL | 3 refills | Status: DC

## 2019-03-24 NOTE — Discharge Instructions (Signed)
You may shower as of Saturday. Keep groins clean and dry.  No driving until cleared during your first post-of visit.  Plavix was escribed to your pharmacy on file

## 2019-03-24 NOTE — Discharge Summary (Signed)
[facility]    Discharge Summary  Patient ID:  Nicole Herman MRN: [id number] DOB/AGE: 1955-09-18 63 y.o.  Admit date: 03/22/2019 Discharge date: 03/24/2019 Date of Surgery: 03/22/2019 Surgeon: Surgeon(s): Schnier, Dolores Lory, MD Algernon Huxley, MD  Admission Diagnosis: RT Carotid Artery Stent Placement   RT Carotid Artery Stenosis   ABBOTT Rep  cc: M Godley, S Willey   Pt to have Covid test on Thur 03-16-19 or Fru 03-17-19 per Mickel Baas  Discharge Diagnoses:  RT Carotid Artery Stent Placement   RT Carotid Artery Stenosis   ABBOTT Rep  cc: M Godley, S Willey   Pt to have Covid test on [place] 03-16-19 or [place] 03-17-19 per Mickel Baas  Secondary Diagnoses: Past Medical History:  Diagnosis Date  . Airway polyps 2017  . Coronary artery disease    s/p carotid surgery left   . Does use hearing aid   . Fibromyalgia   . GERD (gastroesophageal reflux disease)   . Hypertension   . Thyroid disease    Procedure(s): CAROTID PTA/STENT INTERVENTION (Right)  Discharged Condition: Stable  HPI / Hospital Course:  Nicole Herman is a 63 y.o. female is S/P Right: Procedure(s): CAROTID PTA/STENT INTERVENTION  The patient is a 63 year old female with multiple medical issues including carotid artery stenosis who presented with greater than 80% stenosis of the right internal carotid artery and amaurosis fugax.  On March 22, 2019, the patient underwent: 1.  Ultrasound guidance for vascular access right femoral artery 2.  Placement of a 9 mm x 7 mm x 30 mm exact stent with the use of the NAV-6 embolic protection device in the right carotid artery  The patient tolerated the procedure well was transferred from the recovery room to the ICU for observation.  Dopamine was started to recovery room for some asymptomatic hypotension.  Pressors were weaned the day of discharge.  Upon discharge, the patient was tolerating a regular diet, urinating independently, her discomfort was controlled to the use of  p.o. pain medication she was ambulating independently.   Physical exam:  A&Ox3, NAD Face: Symmetrical Neck: Trachea midline.  No swelling or ecchymosis noted. CV: RRR Pulmonary: CTA Bilaterally Abdomen: Soft, Nontender, Nondistended Groin: Dressing clean,dry and intact. No signs of swelling or drainage. No erythema. Vascular:Warm, nontender, minimal edema. Extremities are warm bilaterally distally to toes.  Labs as below  Complications: None  Consults:  Treatment Team:  Yolonda Kida, MD  Significant Diagnostic Studies: CBC Lab Results  Component Value Date   WBC 7.0 03/24/2019   HGB 10.9 (L) 03/24/2019   HCT 33.4 (L) 03/24/2019   MCV 88.4 03/24/2019   PLT 160 03/24/2019   BMET    Component Value Date/Time   NA 142 03/24/2019 0326   NA 139 09/27/2018 0922   NA 139 12/14/2011 0731   K 3.9 03/24/2019 0326   K 4.5 12/14/2011 0731   CL 111 03/24/2019 0326   CL 103 12/14/2011 0731   CO2 28 03/24/2019 0326   CO2 30 12/14/2011 0731   GLUCOSE 110 (H) 03/24/2019 0326   GLUCOSE 126 (H) 12/14/2011 0731   BUN 10 03/24/2019 0326   BUN 15 09/27/2018 0922   BUN 14 12/14/2011 0731   CREATININE 0.78 03/24/2019 0326   CREATININE 1.03 12/14/2011 0731   CALCIUM 8.6 (L) 03/24/2019 0326   CALCIUM 9.1 12/14/2011 0731   GFRNONAA >60 03/24/2019 0326   GFRNONAA 59 (L) 12/14/2011 0731   GFRAA >60 03/24/2019 0326   GFRAA >  60 12/14/2011 0731   COAG No results found for: INR, PROTIME  Disposition:  Discharge to :Home  Allergies as of 03/24/2019      Reactions   Chantix [varenicline Tartrate]    Nightmares    Wellbutrin [bupropion]    Did not like the way it made her feel: "too wired."   Penicillins Rash   Did it involve swelling of the face/tongue/throat, SOB, or low BP? No Did it involve sudden or severe rash/hives, skin peeling, or any reaction on the inside of your mouth or nose? Yes Did you need to seek medical attention at a hospital or doctor's office?  Yes When did it last happen?3-4 years ago If all above answers are "NO", may proceed with cephalosporin use.      Medication List    STOP taking these medications   ALPRAZolam 0.25 MG tablet Commonly known as: XANAX   ezetimibe 10 MG tablet Commonly known as: Zetia   ibuprofen 200 MG tablet Commonly known as: ADVIL     TAKE these medications   albuterol 108 (90 Base) MCG/ACT inhaler Commonly known as: VENTOLIN HFA Inhale 2 puffs into the lungs every 6 (six) hours as needed for wheezing or shortness of breath. AND COUGH What changed:   reasons to take this  additional instructions   aspirin 81 MG chewable tablet Chew 81 mg by mouth daily.   CALCIUM 1200+D3 PO Take 1 tablet by mouth 2 (two) times a week.   clopidogrel 75 MG tablet Commonly known as: PLAVIX Take 1 tablet (75 mg total) by mouth daily. Start taking on: March 25, 2019   Coricidin HBP Congestion/Cough 10-200 MG Caps Generic drug: Dextromethorphan-guaiFENesin Take 1-2 tablets by mouth daily as needed (for cough/congestion.).   diclofenac sodium 1 % Gel Commonly known as: VOLTAREN Apply 4 g topically 2 (two) times daily as needed (neck pain.).   escitalopram 10 MG tablet Commonly known as: Lexapro Take 1 tablet (10 mg total) by mouth daily. FOR GAD   estradiol 0.5 MG tablet Commonly known as: ESTRACE Take 1/2  Tab  By po daily What changed:   how much to take  how to take this  when to take this  additional instructions   fluticasone 50 MCG/ACT nasal spray Commonly known as: FLONASE Place 1 spray into both nostrils daily.   levothyroxine 75 MCG tablet Commonly known as: SYNTHROID TAKE 1 TABLET(75 MCG) BY MOUTH DAILY BEFORE BREAKFAST What changed: See the new instructions.   ondansetron 4 MG disintegrating tablet Commonly known as: Zofran ODT Take 1 tablet (4 mg total) by mouth every 8 (eight) hours as needed.   pantoprazole 40 MG tablet Commonly known as: PROTONIX TAKE 1 TABLET(40  MG) BY MOUTH TWICE DAILY What changed: See the new instructions.   simvastatin 40 MG tablet Commonly known as: ZOCOR Take 1 tablet (40 mg total) by mouth at bedtime.   telmisartan 40 MG tablet Commonly known as: MICARDIS Take 0.5 tablets (20 mg total) by mouth daily. Take 1/2 tab po daily      Verbal and written Discharge instructions given to the patient. Wound care per Discharge AVS Follow-up Information    Kris Hartmann, NP Follow up in 1 week(s).   Specialty: Vascular Surgery Why: First post-op. Incision check. No studies.  Contact information: [place] [address] [phone]          Signed: Sela Hua, PA-C  03/24/2019, 11:38 AM

## 2019-03-24 NOTE — Plan of Care (Signed)
Pt with decreased dizziness.  Tolerating po intake well.  VSS.  Heart rate increases with activity.

## 2019-03-24 NOTE — Progress Notes (Signed)
Sharla Kidney to be D/C'd Home per MD. Discussed with the patient and all questions fully answered.   VSS, skin clean, dry and intact without evidence of skin break down, no evidence of skin tears noted.  IV catheters discontinued intact. Site without signs and symptoms of complications. Dressing and pressure applied.   An after visit summary was printed and given to the patient. Patient received prescriptions.   D/c education completed with patient/family including follow up instructions, medications list, d/c activities limitations if indicated, with other d/c instructions as indicated by MD- patient able to verbalize understanding, all questions fully answered.   Patient instructed to return to ED, call 911, or Call MD for any changes in condition.  Patient escorted via [facility] and D/C home via private auto.

## 2019-03-24 NOTE — Progress Notes (Signed)
PT ambulated 2 laps around RN desk with no dizziness. HR was 65-70 while walking.  Pt understands to take it easy at home after discharge.

## 2019-03-27 ENCOUNTER — Telehealth (INDEPENDENT_AMBULATORY_CARE_PROVIDER_SITE_OTHER): Payer: Self-pay

## 2019-03-27 LAB — POCT ACTIVATED CLOTTING TIME: Activated Clotting Time: 246 seconds

## 2019-03-27 NOTE — Telephone Encounter (Signed)
Patient left a voicemail stating that she had a stent placement on 03/22/2019 and calling to schedule post op appointment and also she is having some dizziness and nausea.I spoke with Dr Delana Meyer and he advise for the patient to come in the office tomorrow to see Eulogio Ditch NP.

## 2019-03-28 ENCOUNTER — Ambulatory Visit (INDEPENDENT_AMBULATORY_CARE_PROVIDER_SITE_OTHER): Payer: No Typology Code available for payment source | Admitting: Nurse Practitioner

## 2019-03-28 ENCOUNTER — Encounter (INDEPENDENT_AMBULATORY_CARE_PROVIDER_SITE_OTHER): Payer: Self-pay | Admitting: Nurse Practitioner

## 2019-03-28 ENCOUNTER — Other Ambulatory Visit: Payer: Self-pay

## 2019-03-28 VITALS — BP 152/75 | HR 54 | Resp 14 | Ht 64.0 in | Wt 136.0 lb

## 2019-03-28 DIAGNOSIS — R001 Bradycardia, unspecified: Secondary | ICD-10-CM

## 2019-03-28 DIAGNOSIS — Z720 Tobacco use: Secondary | ICD-10-CM

## 2019-03-28 DIAGNOSIS — Z79899 Other long term (current) drug therapy: Secondary | ICD-10-CM

## 2019-03-28 DIAGNOSIS — Z9889 Other specified postprocedural states: Secondary | ICD-10-CM

## 2019-03-28 DIAGNOSIS — I6521 Occlusion and stenosis of right carotid artery: Secondary | ICD-10-CM

## 2019-03-28 DIAGNOSIS — E782 Mixed hyperlipidemia: Secondary | ICD-10-CM

## 2019-03-30 ENCOUNTER — Encounter: Payer: Self-pay | Admitting: Internal Medicine

## 2019-03-30 NOTE — Telephone Encounter (Signed)
Per verification patient cost  For Prolia will be $25 per injection , called and notified patient of prolia approval and scheduled for 04/26/19. Patient ask with stent placement should she continue calcium and Vit D, and would Prolia interfere with taking Plavix.

## 2019-03-30 NOTE — Telephone Encounter (Signed)
prolia should be safe with plavix  I think would be safe to resume calcium and vitamin D   TMS

## 2019-03-31 NOTE — Telephone Encounter (Signed)
Pt is aware. Agreeable

## 2019-04-01 NOTE — Progress Notes (Signed)
SUBJECTIVE:  Patient ID: Nicole Herman, female    DOB: 1956/02/29, 63 y.o.   MRN: [id number] Chief Complaint  Patient presents with  . Follow-up    dizzy, and lightheadness seeing floaters    HPI  Nicole Herman is a 63 y.o. female that presents due to complaints of dizziness, nausea and some lightheadedness following carotid stent placement on 03/22/2019.  The patient states that she was having some floaters but these have resolved but she denies amaurosis fugax.  She states that its not really dizziness but just a lightheaded feeling every now and then.  It is not truly disruptive.  Orthostatic blood pressures were taken and she does not exhibit orthostatic changes.  It was worth noting that she was bradycardic during her procedure with a HR in the 30s.  Today it is in the 64s.  She states that her nausea was healed by saltine crackers.   Her surgical site is healing well.  She denies any fever, chills, or vomiting.    Past Medical History:  Diagnosis Date  . Airway polyps 2017  . Colon polyps   . Coronary artery disease    s/p carotid surgery left   . Does use hearing aid   . Fibromyalgia   . GERD (gastroesophageal reflux disease)   . Granuloma annulare   . Heart murmur   . History of chicken pox   . Hypertension   . Thyroid disease     Past Surgical History:  Procedure Laterality Date  . ABDOMINAL HYSTERECTOMY     1985  . CAROTID ENDARTERECTOMY     left   . CAROTID PTA/STENT INTERVENTION Right 03/22/2019   Procedure: CAROTID PTA/STENT INTERVENTION;  Surgeon: Katha Cabal, MD;  Location: [facility];  Service: Cardiovascular;  Laterality: Right;  . CARPAL TUNNEL RELEASE     x2  . [place]  . OOPHORECTOMY     1/2 ovaries removed   . STAPEDES SURGERY     otosclerosis right   . TRIGGER FINGER RELEASE      Social History   Socioeconomic History  . Marital status: Single    Spouse name: Not on file  . Number of children: Not on  file  . Years of education: Not on file  . Highest education level: Not on file  Occupational History  . Not on file  Social Needs  . Financial resource strain: Not on file  . Food insecurity    Worry: Not on file    Inability: Not on file  . Transportation needs    Medical: Not on file    Non-medical: Not on file  Tobacco Use  . Smoking status: Current Every Day Smoker    Packs/day: 1.00    Types: Cigarettes  . Smokeless tobacco: Never Used  Substance and Sexual Activity  . Alcohol use: No  . Drug use: No  . Sexual activity: Yes  Lifestyle  . Physical activity    Days per week: Not on file    Minutes per session: Not on file  . Stress: Not on file  Relationships  . Social Herbalist on phone: Not on file    Gets together: Not on file    Attends religious service: Not on file    Active member of club or organization: Not on file    Attends meetings of clubs or organizations: Not on file    Relationship status: Not on  file  . Intimate partner violence    Fear of current or ex partner: Not on file    Emotionally abused: Not on file    Physically abused: Not on file    Forced sexual activity: Not on file  Other Topics Concern  . Not on file  Social History Narrative   1 son and 1 grandchild in [place] in [place]    1 fiance of 14 years as of 03/15/2019 (with h/o alcoholism, cardiac defibrillator, liver failure)    Was CNA    Family History  Problem Relation Age of Onset  . Hypertension Mother   . Dementia Mother   . Arthritis Mother   . Hyperlipidemia Mother   . Cancer Father        colon   . Early death Father   . Learning disabilities Brother   . Colon polyps Brother   . Hyperlipidemia Brother   . Hypertension Brother   . Heart attack Maternal Grandmother   . Arthritis Maternal Grandmother   . Heart disease Maternal Grandmother   . Colon polyps Sister   . Lupus Sister   . Heart disease Maternal Grandfather   . Heart disease Paternal Grandmother    . Cancer Other     Allergies  Allergen Reactions  . Chantix [Varenicline Tartrate]     Nightmares    . Wellbutrin [Bupropion]     Did not like the way it made her feel: "too wired."   . Penicillins Rash    Did it involve swelling of the face/tongue/throat, SOB, or low BP? No Did it involve sudden or severe rash/hives, skin peeling, or any reaction on the inside of your mouth or nose? Yes Did you need to seek medical attention at a hospital or doctor's office? Yes When did it last happen?3-4 years ago If all above answers are "NO", may proceed with cephalosporin use.      Review of Systems   Review of Systems: Negative Unless Checked Constitutional: [] Weight loss  [] Fever  [] Chills Cardiac: [] Chest pain   []  Atrial Fibrillation  [] Palpitations   [] Shortness of breath when laying flat   [] Shortness of breath with exertion. [] Shortness of breath at rest Vascular:  [] Pain in legs with walking   [] Pain in legs with standing [] Pain in legs when laying flat   [] Claudication    [] Pain in feet when laying flat    [] History of DVT   [] Phlebitis   [] Swelling in legs   [] Varicose veins   [] Non-healing ulcers Pulmonary:   [] Uses home oxygen   [] Productive cough   [] Hemoptysis   [] Wheeze  [x] COPD   [] Asthma Neurologic:  [] Dizziness   [] Seizures  [] Blackouts [] History of stroke   [] History of TIA  [] Aphasia   [] Temporary Blindness   [] Weakness or numbness in arm   [] Weakness or numbness in leg Musculoskeletal:   [] Joint swelling   [] Joint pain   [] Low back pain  []  History of Knee Replacement [x] Arthritis [] back Surgeries  []  Spinal Stenosis    Hematologic:  [] Easy bruising  [] Easy bleeding   [] Hypercoagulable state   [] Anemic Gastrointestinal:  [] Diarrhea   [] Vomiting  [] Gastroesophageal reflux/heartburn   [] Difficulty swallowing. [] Abdominal pain Genitourinary:  [] Chronic kidney disease   [] Difficult urination  [] Anuric   [] Blood in urine [] Frequent urination  [] Burning with urination    [] Hematuria Skin:  [] Rashes   [] Ulcers [] Wounds Psychological:  [x] History of anxiety   []  History of major depression  []  Memory Difficulties  OBJECTIVE:   Physical Exam  BP (!) 152/75   Pulse (!) 54   Resp 14   Ht 5\' 4"  (1.626 m)   Wt 136 lb (61.7 kg)   BMI 23.34 kg/m   Gen: WD/WN, NAD Head: [place]/AT, No temporalis wasting.  Ear/Nose/Throat: Hearing grossly intact, nares w/o erythema or drainage Eyes: PER, EOMI, sclera nonicteric.  Neck: Supple, no masses.  No JVD.  Pulmonary:  Good air movement, no use of accessory muscles.  Cardiac: RRR Vascular:  Vessel Right Left  Radial Palpable Palpable   Gastrointestinal: soft, non-distended. No guarding/no peritoneal signs.  Musculoskeletal: M/S 5/5 throughout.  No deformity or atrophy.  Neurologic: Pain and light touch intact in extremities.  Symmetrical.  Speech is fluent. Motor exam as listed above. Psychiatric: Judgment intact, Mood & affect appropriate for pt's clinical situation. Dermatologic: No Venous rashes. No Ulcers Noted.  No changes consistent with cellulitis. Lymph : No Cervical lymphadenopathy, no lichenification or skin changes of chronic lymphedema.       ASSESSMENT AND PLAN:  1. Symptomatic stenosis of right carotid artery without infarction The patient underwent carotid stent placement.  She will return in several weeks for carotid duplex.   2. Mixed hyperlipidemia Continue statin as ordered and reviewed, no changes at this time   3. Tobacco abuse Smoking cessation was discussed, 3-10 minutes spent on this topic specifically   4. Bradycardia Patient may have transient episodes of low heart rate or she could possibly just be recovering from anesthesia.  If she continues to have lightheadedness at the next visit, we may to refer her to cardiology for monitoring purposes such as a zio patch or holter monitor.      Current Outpatient Medications on File Prior to Visit  Medication Sig Dispense Refill  .  aspirin 81 MG chewable tablet Chew 81 mg by mouth daily.     . clopidogrel (PLAVIX) 75 MG tablet Take 1 tablet (75 mg total) by mouth daily. 90 tablet 3  . estradiol (ESTRACE) 0.5 MG tablet Take 1/2  Tab  By po daily (Patient taking differently: Take 0.25 mg by mouth daily. ) 30 tablet 3  . fluticasone (FLONASE) 50 MCG/ACT nasal spray Place 1 spray into both nostrils daily.    Marland Kitchen levothyroxine (SYNTHROID) 75 MCG tablet TAKE 1 TABLET(75 MCG) BY MOUTH DAILY BEFORE BREAKFAST (Patient taking differently: Take 75 mcg by mouth daily before breakfast. ) 30 tablet 0  . pantoprazole (PROTONIX) 40 MG tablet TAKE 1 TABLET(40 MG) BY MOUTH TWICE DAILY (Patient taking differently: Take 40 mg by mouth 2 (two) times a day. ) 60 tablet 3  . simvastatin (ZOCOR) 40 MG tablet Take 1 tablet (40 mg total) by mouth at bedtime. 30 tablet 0  . telmisartan (MICARDIS) 40 MG tablet Take 0.5 tablets (20 mg total) by mouth daily. Take 1/2 tab po daily 45 tablet 3  . albuterol (VENTOLIN HFA) 108 (90 Base) MCG/ACT inhaler Inhale 2 puffs into the lungs every 6 (six) hours as needed for wheezing or shortness of breath. AND COUGH (Patient not taking: Reported on 03/28/2019) 1 Inhaler 0  . Calcium-Magnesium-Vitamin D (CALCIUM 1200+D3 PO) Take 1 tablet by mouth 2 (two) times a week.    Marland Kitchen Dextromethorphan-guaiFENesin (CORICIDIN HBP CONGESTION/COUGH) 10-200 MG CAPS Take 1-2 tablets by mouth daily as needed (for cough/congestion.).    Marland Kitchen diclofenac sodium (VOLTAREN) 1 % GEL Apply 4 g topically 2 (two) times daily as needed (neck pain.).     Marland Kitchen escitalopram (LEXAPRO) 10  MG tablet Take 1 tablet (10 mg total) by mouth daily. FOR GAD (Patient not taking: Reported on 03/28/2019) 30 tablet 2  . ondansetron (ZOFRAN ODT) 4 MG disintegrating tablet Take 1 tablet (4 mg total) by mouth every 8 (eight) hours as needed. (Patient not taking: Reported on 03/28/2019) 20 tablet 0   No current facility-administered medications on file prior to visit.     There  are no Patient Instructions on file for this visit. No follow-ups on file.   Kris Hartmann, NP  This note was completed with Sales executive.  Any errors are purely unintentional.

## 2019-04-06 ENCOUNTER — Other Ambulatory Visit: Payer: Self-pay | Admitting: Internal Medicine

## 2019-04-06 ENCOUNTER — Telehealth: Payer: Self-pay | Admitting: Internal Medicine

## 2019-04-06 MED ORDER — LEVOTHYROXINE SODIUM 75 MCG PO TABS: ORAL_TABLET | ORAL | 3 refills | Status: DC

## 2019-04-06 NOTE — Telephone Encounter (Signed)
Pt request refill  levothyroxine (SYNTHROID) 75 MCG tablet  Dr Linus Orn has never written for her, she is a new pt. Will need new Rx  [id number] - [address], [place] [phone] (Phone) [phone] (Fax)

## 2019-04-18 ENCOUNTER — Other Ambulatory Visit: Payer: Self-pay | Admitting: Internal Medicine

## 2019-04-18 ENCOUNTER — Telehealth: Payer: Self-pay | Admitting: Internal Medicine

## 2019-04-18 DIAGNOSIS — I6523 Occlusion and stenosis of bilateral carotid arteries: Secondary | ICD-10-CM

## 2019-04-18 DIAGNOSIS — E785 Hyperlipidemia, unspecified: Secondary | ICD-10-CM

## 2019-04-18 MED ORDER — SIMVASTATIN 40 MG PO TABS: 40.0000 mg | ORAL_TABLET | Freq: Every day | ORAL | 3 refills | Status: DC

## 2019-04-18 NOTE — Telephone Encounter (Signed)
Medication Refill - Medication: simvastatin (ZOCOR) 40 MG tablet  Pt states she was instructed to call when she needed a refill on this medication bc it was originally prescribed by another provider.   Preferred Pharmacy:  [id number] [address], [place] [phone] (Phone) [phone] (Fax)     Agent: Please be advised that RX refills may take up to 3 business days. We ask that you follow-up with your pharmacy.

## 2019-04-18 NOTE — Telephone Encounter (Signed)
Patient is requesting refill on simvastatin (Zocor).  Rx was originally prescribed by Dr. Humphrey Rolls.  Rx notes state that pt needs an appt for further refill.  Last OV w/ Dr. Aundra Dubin:  03/15/19  Last lipid panel:  03/10/19  Last Refill:  02/24/19

## 2019-04-24 ENCOUNTER — Telehealth (INDEPENDENT_AMBULATORY_CARE_PROVIDER_SITE_OTHER): Payer: Self-pay

## 2019-04-24 ENCOUNTER — Encounter: Payer: Self-pay | Admitting: Gastroenterology

## 2019-04-24 ENCOUNTER — Ambulatory Visit: Payer: No Typology Code available for payment source | Admitting: Gastroenterology

## 2019-04-24 ENCOUNTER — Other Ambulatory Visit: Payer: Self-pay

## 2019-04-24 ENCOUNTER — Encounter (INDEPENDENT_AMBULATORY_CARE_PROVIDER_SITE_OTHER): Payer: Self-pay

## 2019-04-24 ENCOUNTER — Telehealth: Payer: Self-pay | Admitting: Internal Medicine

## 2019-04-24 ENCOUNTER — Other Ambulatory Visit: Payer: Self-pay | Admitting: Internal Medicine

## 2019-04-24 VITALS — BP 175/89 | HR 71 | Temp 97.5°F | Resp 17 | Ht 64.0 in | Wt 136.4 lb

## 2019-04-24 DIAGNOSIS — R1012 Left upper quadrant pain: Secondary | ICD-10-CM

## 2019-04-24 DIAGNOSIS — Z86010 Personal history of colonic polyps: Secondary | ICD-10-CM

## 2019-04-24 DIAGNOSIS — G8929 Other chronic pain: Secondary | ICD-10-CM

## 2019-04-24 DIAGNOSIS — D649 Anemia, unspecified: Secondary | ICD-10-CM | POA: Diagnosis not present

## 2019-04-24 DIAGNOSIS — K219 Gastro-esophageal reflux disease without esophagitis: Secondary | ICD-10-CM

## 2019-04-24 NOTE — Telephone Encounter (Signed)
Blood pressure is high  Would she be agreeable to add norvasc 2.5 to telmisartan? And take both?   Nicole Herman

## 2019-04-24 NOTE — Progress Notes (Signed)
[name], MD [address]  [place]  [place], [place] [address]  Main: [phone]  Fax: [phone]    Gastroenterology Consultation  Referring Provider:     Lavera Guise, MD Primary Care Physician:  McLean-Scocuzza, Nino Glow, MD Primary Gastroenterologist:  Dr. [name] Reason for Consultation:     Reflux        HPI:   Nicole Herman is a 63 y.o. female referred by Dr. Terese Door, Nino Glow, MD  for consultation & management of reflux.  Patient reports she has been experiencing symptoms of regurgitation, burning in her chest, intermittent, sporadic left upper quadrant pain radiating to lower back.  She reports that her reflux symptoms have been ongoing for several years, has been maintained on Protonix 40 mg twice daily which keeps her symptoms under control.  She is followed by ENT, Dr. Pryor Ochoa for airway polyps.  She tried to switch to a different proton pump inhibitor, was not effective.  She also underwent carotid artery stent placement last month and has been on Plavix since then.  She is currently dealing with intermittent episodes of lightheadedness, dizziness with intermittent bradycardia for which she is seeing Dr. Clayborn Bigness.  She is an active smoker.  She denies black stools, blood in the stools, epigastric pain, nausea or vomiting.  Her last hemoglobin was 10.1 about a month ago, normal MCV.  She does not have chronic liver disease. She denies weight loss.  She does have history of colon polyps and underwent colonoscopy several years ago.  NSAIDs: None  Antiplts/Anticoagulants/Anti thrombotics: None  GI Procedures: Colonoscopy more than 10 years ago, found to have colon polyps  Past Medical History:  Diagnosis Date  . Airway polyps 2017  . Colon polyps   . Coronary artery disease    s/p carotid surgery left   . Does use hearing aid   . Fibromyalgia   . GERD (gastroesophageal reflux disease)   . Granuloma annulare   . Heart murmur   . History  of chicken pox   . Hypertension   . Thyroid disease     Past Surgical History:  Procedure Laterality Date  . ABDOMINAL HYSTERECTOMY     1985  . CAROTID ENDARTERECTOMY     left   . CAROTID PTA/STENT INTERVENTION Right 03/22/2019   Procedure: CAROTID PTA/STENT INTERVENTION;  Surgeon: Katha Cabal, MD;  Location: [facility];  Service: Cardiovascular;  Laterality: Right;  . CARPAL TUNNEL RELEASE     x2  . [place]  . OOPHORECTOMY     1/2 ovaries removed   . STAPEDES SURGERY     otosclerosis right   . TRIGGER FINGER RELEASE      Current Outpatient Medications:  .  aspirin 81 MG chewable tablet, Chew 81 mg by mouth daily. , Disp: , Rfl:  .  Calcium-Magnesium-Vitamin D (CALCIUM 1200+D3 PO), Take 1 tablet by mouth 2 (two) times a week., Disp: , Rfl:  .  clopidogrel (PLAVIX) 75 MG tablet, Take 1 tablet (75 mg total) by mouth daily., Disp: 90 tablet, Rfl: 3 .  estradiol (ESTRACE) 0.5 MG tablet, Take 1/2  Tab  By po daily (Patient taking differently: Take 0.25 mg by mouth daily. ), Disp: 30 tablet, Rfl: 3 .  fluticasone (FLONASE) 50 MCG/ACT nasal spray, Place 1 spray into both nostrils daily., Disp: , Rfl:  .  levothyroxine (SYNTHROID) 75 MCG tablet, TAKE 1 TABLET(75 MCG) BY MOUTH DAILY  BEFORE BREAKFAST. SKIP SUNDAYS, Disp: 90 tablet, Rfl: 3 .  pantoprazole (PROTONIX) 40 MG tablet, TAKE 1 TABLET(40 MG) BY MOUTH TWICE DAILY (Patient taking differently: Take 40 mg by mouth 2 (two) times a day. ), Disp: 60 tablet, Rfl: 3 .  simvastatin (ZOCOR) 40 MG tablet, Take 1 tablet (40 mg total) by mouth daily at 6 PM., Disp: 90 tablet, Rfl: 3 .  telmisartan (MICARDIS) 40 MG tablet, Take 0.5 tablets (20 mg total) by mouth daily. Take 1/2 tab po daily, Disp: 45 tablet, Rfl: 3 .  albuterol (VENTOLIN HFA) 108 (90 Base) MCG/ACT inhaler, Inhale 2 puffs into the lungs every 6 (six) hours as needed for wheezing or shortness of breath. AND COUGH (Patient not taking: Reported on  03/28/2019), Disp: 1 Inhaler, Rfl: 0 .  Dextromethorphan-guaiFENesin (CORICIDIN HBP CONGESTION/COUGH) 10-200 MG CAPS, Take 1-2 tablets by mouth daily as needed (for cough/congestion.)., Disp: , Rfl:  .  diclofenac sodium (VOLTAREN) 1 % GEL, Apply 4 g topically 2 (two) times daily as needed (neck pain.). , Disp: , Rfl:  .  escitalopram (LEXAPRO) 10 MG tablet, Take 1 tablet (10 mg total) by mouth daily. FOR GAD (Patient not taking: Reported on 03/28/2019), Disp: 30 tablet, Rfl: 2 .  ondansetron (ZOFRAN ODT) 4 MG disintegrating tablet, Take 1 tablet (4 mg total) by mouth every 8 (eight) hours as needed. (Patient not taking: Reported on 04/24/2019), Disp: 20 tablet, Rfl: 0    Family History  Problem Relation Age of Onset  . Hypertension Mother   . Dementia Mother   . Arthritis Mother   . Hyperlipidemia Mother   . Cancer Father        colon   . Early death Father   . Learning disabilities Brother   . Colon polyps Brother   . Hyperlipidemia Brother   . Hypertension Brother   . Heart attack Maternal Grandmother   . Arthritis Maternal Grandmother   . Heart disease Maternal Grandmother   . Colon polyps Sister   . Lupus Sister   . Heart disease Maternal Grandfather   . Heart disease Paternal Grandmother   . Cancer Other      Social History   Tobacco Use  . Smoking status: Current Every Day Smoker    Packs/day: 1.00    Types: Cigarettes  . Smokeless tobacco: Never Used  Substance Use Topics  . Alcohol use: No  . Drug use: No    Allergies as of 04/24/2019 - Review Complete 04/24/2019  Allergen Reaction Noted  . Chantix [varenicline tartrate]  03/15/2019  . Wellbutrin [bupropion]  03/15/2019  . Penicillins Rash 09/16/2017    Review of Systems:    All systems reviewed and negative except where noted in HPI.   Physical Exam:  BP (!) 175/89 (BP Location: Left Arm, Patient Position: Sitting, Cuff Size: Normal)   Pulse 71   Temp (!) 97.5 F (36.4 C)   Resp 17   Ht 5\' 4"  (1.626  m)   Wt 136 lb 6.4 oz (61.9 kg)   BMI 23.41 kg/m  No LMP recorded. Patient has had a hysterectomy.  General:   Alert,  Well-developed, well-nourished, pleasant and cooperative in NAD Head:  Normocephalic and atraumatic. Eyes:  Sclera clear, no icterus.   Conjunctiva pink. Ears:  Normal auditory acuity. Nose:  No deformity, discharge, or lesions. Mouth:  No deformity or lesions,oropharynx pink & moist. Neck:  Supple; no masses or thyromegaly. Lungs:  Respirations even and unlabored.  Clear throughout to auscultation.  No wheezes, crackles, or rhonchi. No acute distress. Heart:  Regular rate and rhythm; no murmurs, clicks, rubs, or gallops. Abdomen:  Normal bowel sounds. Soft, non-tender and non-distended without masses, hepatosplenomegaly or hernias noted.  No guarding or rebound tenderness.   Rectal: Not performed Msk:  Symmetrical without gross deformities. Good, equal movement & strength bilaterally. Pulses:  Normal pulses noted. Extremities:  No clubbing or edema.  No cyanosis. Neurologic:  Alert and oriented x3;  grossly normal neurologically. Skin:  Intact without significant lesions or rashes. No jaundice. Psych:  Alert and cooperative. Normal mood and affect.  Imaging Studies: No abdominal imaging  Assessment and Plan:   Nicole Herman is a 63 y.o. female with chronic tobacco use, carotid artery stenosis, left carotid endarterectomy, right carotid artery stent placement on Plavix, symptomatic bradycardia is referred for management of GERD, left upper quadrant pain  GERD/left upper quadrant pain Continue Protonix 40 mg twice daily before meals Continue antireflux measures, avoid food triggers Recommend H. pylori breath test Recommend EGD after obtaining clearance from ENT, vascular as well as cardiology  History of colon polyps Recommend colonoscopy after obtaining clearance from ENT, vascular as well as cardiology  Normocytic anemia Check iron studies, B12 and folate  levels   Follow up in 1 month   [name], MD

## 2019-04-24 NOTE — Telephone Encounter (Signed)
Patient called stating since her procedure she has been having nausea,light headed, feel dizziness when bending over, and wanted to know if the blood thinner could be causing these symptoms. I spoke with Dr Delana Meyer and he recommended for the patient have a follow up with Dr Clayborn Bigness to see if she need a medication adjustment. Patient has been aware with medical advice and verbalize understanding

## 2019-04-25 ENCOUNTER — Other Ambulatory Visit (INDEPENDENT_AMBULATORY_CARE_PROVIDER_SITE_OTHER): Payer: Self-pay | Admitting: Nurse Practitioner

## 2019-04-25 LAB — B12 AND FOLATE PANEL
Folate: 9.1 ng/mL (ref >3.0)
Vitamin B-12: 709 pg/mL (ref 232–1245)

## 2019-04-25 LAB — CBC
Hematocrit: 33.6 % — ABNORMAL LOW (ref 34.0–46.6)
Hemoglobin: 11.2 g/dL (ref 11.1–15.9)
MCH: 28.1 pg (ref 26.6–33.0)
MCHC: 33.3 g/dL (ref 31.5–35.7)
MCV: 84 fL (ref 79–97)
Platelets: 193 10*3/uL (ref 150–450)
RBC: 3.99 x10E6/uL (ref 3.77–5.28)
RDW: 13.2 % (ref 11.7–15.4)
WBC: 6.6 10*3/uL (ref 3.4–10.8)

## 2019-04-25 LAB — FERRITIN: Ferritin: 13 ng/mL — ABNORMAL LOW (ref 15–150)

## 2019-04-25 LAB — H. PYLORI BREATH TEST: H pylori Breath Test: NEGATIVE

## 2019-04-25 LAB — IRON AND TIBC
Iron Saturation: 5 % — CL (ref 15–55)
Iron: 21 ug/dL — ABNORMAL LOW (ref 27–139)
Total Iron Binding Capacity: 465 ug/dL — ABNORMAL HIGH (ref 250–450)
UIBC: 444 ug/dL — ABNORMAL HIGH (ref 118–369)

## 2019-04-25 NOTE — Telephone Encounter (Signed)
She wants to wait. GI told her she has anemia and she has cardiology appointment this Thursday. She will wait for now.

## 2019-04-25 NOTE — Telephone Encounter (Signed)
Left message for patient to return call back. P[facility] may give results and obtain information.  

## 2019-04-25 NOTE — Telephone Encounter (Signed)
sch appt with me in 05/2019   [place]

## 2019-04-25 NOTE — Telephone Encounter (Signed)
Patient wants to wait

## 2019-04-26 ENCOUNTER — Other Ambulatory Visit: Payer: Self-pay

## 2019-04-26 ENCOUNTER — Other Ambulatory Visit: Payer: Self-pay | Admitting: Internal Medicine

## 2019-04-26 ENCOUNTER — Ambulatory Visit (INDEPENDENT_AMBULATORY_CARE_PROVIDER_SITE_OTHER): Payer: No Typology Code available for payment source

## 2019-04-26 ENCOUNTER — Telehealth: Payer: Self-pay | Admitting: Internal Medicine

## 2019-04-26 DIAGNOSIS — E8940 Asymptomatic postprocedural ovarian failure: Secondary | ICD-10-CM

## 2019-04-26 DIAGNOSIS — K219 Gastro-esophageal reflux disease without esophagitis: Secondary | ICD-10-CM

## 2019-04-26 DIAGNOSIS — M810 Age-related osteoporosis without current pathological fracture: Secondary | ICD-10-CM

## 2019-04-26 DIAGNOSIS — Q782 Osteopetrosis: Secondary | ICD-10-CM | POA: Diagnosis not present

## 2019-04-26 MED ORDER — DENOSUMAB 60 MG/ML ~~LOC~~ SOSY
60.0000 mg | PREFILLED_SYRINGE | Freq: Once | SUBCUTANEOUS | Status: AC
  Administered 2019-04-26: 60 mg via SUBCUTANEOUS

## 2019-04-26 MED ORDER — PANTOPRAZOLE SODIUM 40 MG PO TBEC: 40.0000 mg | DELAYED_RELEASE_TABLET | Freq: Two times a day (BID) | ORAL | 1 refills | Status: DC

## 2019-04-26 MED ORDER — PANTOPRAZOLE SODIUM 40 MG PO TBEC: 40.0000 mg | DELAYED_RELEASE_TABLET | Freq: Every day | ORAL | 3 refills | Status: DC

## 2019-04-26 NOTE — Progress Notes (Signed)
Patient presented today for first Prolia injection per MD order (See Telephone Note on 03/15/19).   Administered SQ in left arm.  Patient tolerated well with no signs of distress.  At visit patient requested refills on estradiol and pantoprazole.  Request verbally given to Dr. Aundra Dubin.   Called pt back to inform pt that Dr. Aundra Dubin did not feel comfortable refilling estradiol since pt is a smoker.  Pt said that she will see a OBGYN for refills on estradiol.  Dr. Aundra Dubin offered to put in a referral for OBGYN.  Pt ok to be referred.  Dr. Aundra Dubin sent in refill for pantoprazole after counseling pt on long-term use.

## 2019-04-26 NOTE — Telephone Encounter (Signed)
protonix 2x per day resent  Call pharmacy to clarify    [place]

## 2019-04-26 NOTE — Telephone Encounter (Signed)
The pharmacy called stating they are needing clarification on the directions for pts pantoprazole. Please advise.   [id number] [address], [place] AT [facility] OF [address]  [place] [address]  Phone: [phone] Fax: [phone]  Not a 24 hour pharmacy; exact hours not known.

## 2019-04-27 ENCOUNTER — Telehealth: Payer: Self-pay

## 2019-04-27 ENCOUNTER — Other Ambulatory Visit: Payer: Self-pay | Admitting: Internal Medicine

## 2019-04-27 DIAGNOSIS — K219 Gastro-esophageal reflux disease without esophagitis: Secondary | ICD-10-CM

## 2019-04-27 MED ORDER — PANTOPRAZOLE SODIUM 40 MG PO TBEC: 40.0000 mg | DELAYED_RELEASE_TABLET | Freq: Two times a day (BID) | ORAL | 1 refills | Status: AC

## 2019-04-27 NOTE — Telephone Encounter (Signed)
Pharmacy has been notified.

## 2019-04-27 NOTE — Telephone Encounter (Signed)
Pharmacy called wanting to get clarification on instructions for pantoprazole.  Pharmacy says that there are two sets of directions.  [facility]- [phone]

## 2019-04-27 NOTE — Telephone Encounter (Signed)
2x per day before meals 30 minutes  Further refills Dr. Marius Ditch GI

## 2019-04-27 NOTE — Telephone Encounter (Signed)
Called pharmacy and gave clarification

## 2019-05-03 ENCOUNTER — Other Ambulatory Visit (INDEPENDENT_AMBULATORY_CARE_PROVIDER_SITE_OTHER): Payer: Self-pay | Admitting: Nurse Practitioner

## 2019-05-03 DIAGNOSIS — I6521 Occlusion and stenosis of right carotid artery: Secondary | ICD-10-CM

## 2019-05-03 DIAGNOSIS — Z959 Presence of cardiac and vascular implant and graft, unspecified: Secondary | ICD-10-CM

## 2019-05-04 ENCOUNTER — Other Ambulatory Visit: Payer: Self-pay

## 2019-05-04 ENCOUNTER — Ambulatory Visit (INDEPENDENT_AMBULATORY_CARE_PROVIDER_SITE_OTHER): Payer: No Typology Code available for payment source | Admitting: Vascular Surgery

## 2019-05-04 ENCOUNTER — Ambulatory Visit (INDEPENDENT_AMBULATORY_CARE_PROVIDER_SITE_OTHER): Payer: No Typology Code available for payment source

## 2019-05-04 ENCOUNTER — Encounter (INDEPENDENT_AMBULATORY_CARE_PROVIDER_SITE_OTHER): Payer: Self-pay | Admitting: Vascular Surgery

## 2019-05-04 VITALS — BP 148/81 | HR 65 | Resp 10 | Ht 64.0 in | Wt 137.0 lb

## 2019-05-04 DIAGNOSIS — I6521 Occlusion and stenosis of right carotid artery: Secondary | ICD-10-CM | POA: Diagnosis not present

## 2019-05-04 DIAGNOSIS — Z959 Presence of cardiac and vascular implant and graft, unspecified: Secondary | ICD-10-CM | POA: Diagnosis not present

## 2019-05-04 MED ORDER — DOXYCYCLINE HYCLATE 100 MG PO CAPS: 100.0000 mg | ORAL_CAPSULE | Freq: Two times a day (BID) | ORAL | 0 refills | Status: DC

## 2019-05-04 NOTE — Progress Notes (Signed)
Patient ID: Nicole Herman, female   DOB: 10-11-1955, 63 y.o.   MRN: [id number]  Chief Complaint  Patient presents with  . Follow-up    HPI Nicole Herman is a 63 y.o. female.    The patient reports her dizziness is resolved.  She denies neck pain.  No amaurosis fugax or TIA symptoms.   Past Medical History:  Diagnosis Date  . Airway polyps 2017  . Colon polyps   . Coronary artery disease    s/p carotid surgery left   . Does use hearing aid   . Fibromyalgia   . GERD (gastroesophageal reflux disease)   . Granuloma annulare   . Heart murmur   . History of chicken pox   . Hypertension   . Thyroid disease     Past Surgical History:  Procedure Laterality Date  . ABDOMINAL HYSTERECTOMY     1985  . CAROTID ENDARTERECTOMY     left   . CAROTID PTA/STENT INTERVENTION Right 03/22/2019   Procedure: CAROTID PTA/STENT INTERVENTION;  Surgeon: Katha Cabal, MD;  Location: [facility];  Service: Cardiovascular;  Laterality: Right;  . CARPAL TUNNEL RELEASE     x2  . [place]  . OOPHORECTOMY     1/2 ovaries removed   . STAPEDES SURGERY     otosclerosis right   . TRIGGER FINGER RELEASE        Allergies  Allergen Reactions  . Chantix [Varenicline Tartrate]     Nightmares    . Wellbutrin [Bupropion]     Did not like the way it made her feel: "too wired."   . Penicillins Rash    Did it involve swelling of the face/tongue/throat, SOB, or low BP? No Did it involve sudden or severe rash/hives, skin peeling, or any reaction on the inside of your mouth or nose? Yes Did you need to seek medical attention at a hospital or doctor's office? Yes When did it last happen?3-4 years ago If all above answers are "NO", may proceed with cephalosporin use.     Current Outpatient Medications  Medication Sig Dispense Refill  . albuterol (VENTOLIN HFA) 108 (90 Base) MCG/ACT inhaler Inhale 2 puffs into the lungs every 6 (six) hours as needed for wheezing or  shortness of breath. AND COUGH 1 Inhaler 0  . aspirin 81 MG chewable tablet Chew 81 mg by mouth daily.     . Calcium-Magnesium-Vitamin D (CALCIUM 1200+D3 PO) Take 1 tablet by mouth 2 (two) times a week.    . clopidogrel (PLAVIX) 75 MG tablet Take 1 tablet (75 mg total) by mouth daily. 90 tablet 3  . diclofenac sodium (VOLTAREN) 1 % GEL Apply 4 g topically 2 (two) times daily as needed (neck pain.).     Marland Kitchen estradiol (ESTRACE) 0.5 MG tablet Take 1/2  Tab  By po daily (Patient taking differently: Take 0.25 mg by mouth daily. ) 30 tablet 3  . fluticasone (FLONASE) 50 MCG/ACT nasal spray Place 1 spray into both nostrils daily.    Marland Kitchen levothyroxine (SYNTHROID) 75 MCG tablet TAKE 1 TABLET(75 MCG) BY MOUTH DAILY BEFORE BREAKFAST. SKIP SUNDAYS 90 tablet 3  . pantoprazole (PROTONIX) 40 MG tablet Take 1 tablet (40 mg total) by mouth 2 (two) times daily before a meal. 30 minutes before meal 180 tablet 1  . simvastatin (ZOCOR) 40 MG tablet Take 1 tablet (40 mg total) by mouth daily at 6 PM. 90 tablet 3  .  telmisartan (MICARDIS) 40 MG tablet Take 0.5 tablets (20 mg total) by mouth daily. Take 1/2 tab po daily 45 tablet 3  . doxycycline (VIBRAMYCIN) 100 MG capsule Take 1 capsule (100 mg total) by mouth 2 (two) times daily. 28 capsule 0   No current facility-administered medications for this visit.         Physical Exam BP (!) 148/81 (BP Location: Left Arm, Patient Position: Sitting, Cuff Size: Normal)   Pulse 65   Resp 10   Ht 5\' 4"  (1.626 m)   Wt 137 lb (62.1 kg)   BMI 23.52 kg/m  Gen:  WD/WN, NAD Skin: incision shows a small bit of drainage it appears to be consistent with a stitch abscess Neurologic: 5 out of 5 motor strength sensory intact speech is normal     Assessment/Plan: 1. Symptomatic stenosis of right carotid artery without infarction Recommend:  Given the patient's asymptomatic subcritical stenosis no further invasive testing or surgery at this time.  Duplex ultrasound shows <30%  stenosis bilaterally, widely patent right stent and left CEA.  Continue antiplatelet therapy as prescribed Continue management of CAD, HTN and Hyperlipidemia Healthy heart diet,  encouraged exercise at least 4 times per week Follow up in 6 months with duplex ultrasound and physical exam   It is okay to stop her Plavix temporarily beginning in October.  Consequently she may proceed with the removal of her polyps.  At any point after that she can also undergo the colonoscopy and EGD.  I would restart the Plavix as soon as possible after these procedures.  - VAS US CAROTID; Future      Hortencia Pilar 05/04/2019, 12:09 PM   This note was created with Dragon medical transcription system.  Any errors from dictation are unintentional.

## 2019-05-23 ENCOUNTER — Ambulatory Visit: Payer: No Typology Code available for payment source | Admitting: Gastroenterology

## 2019-05-31 ENCOUNTER — Telehealth (INDEPENDENT_AMBULATORY_CARE_PROVIDER_SITE_OTHER): Payer: Self-pay | Admitting: Vascular Surgery

## 2019-05-31 NOTE — Telephone Encounter (Signed)
She will not be stopping the Plavix permanently.  Dr. Delana Meyer gave her permission to stop her blood thinner 5 days prior to her colonoscopy but they should be restarted within 24 hours after the procedure.  The patient has a stent in her carotid artery.  She will have to remain on blood thinners.

## 2019-05-31 NOTE — Telephone Encounter (Signed)
Patient called stating [facility] told her she could d/c blood thinner at the end of this month but would like to know how. Please advise. AS, CMA

## 2019-05-31 NOTE — Telephone Encounter (Signed)
Patient has been made aware of below and verbalized understanding. AS, CMA

## 2019-06-05 ENCOUNTER — Telehealth: Payer: Self-pay | Admitting: Gastroenterology

## 2019-06-05 NOTE — Telephone Encounter (Signed)
Pt left vm she has had diarrhea for a week now and needs to speak with someone please call pt

## 2019-06-07 ENCOUNTER — Other Ambulatory Visit: Payer: Self-pay

## 2019-06-07 ENCOUNTER — Ambulatory Visit (INDEPENDENT_AMBULATORY_CARE_PROVIDER_SITE_OTHER): Payer: No Typology Code available for payment source | Admitting: Internal Medicine

## 2019-06-07 VITALS — Ht 64.0 in | Wt 137.0 lb

## 2019-06-07 DIAGNOSIS — R197 Diarrhea, unspecified: Secondary | ICD-10-CM

## 2019-06-07 DIAGNOSIS — I10 Essential (primary) hypertension: Secondary | ICD-10-CM

## 2019-06-07 DIAGNOSIS — E039 Hypothyroidism, unspecified: Secondary | ICD-10-CM | POA: Diagnosis not present

## 2019-06-07 DIAGNOSIS — E782 Mixed hyperlipidemia: Secondary | ICD-10-CM | POA: Diagnosis not present

## 2019-06-07 DIAGNOSIS — R739 Hyperglycemia, unspecified: Secondary | ICD-10-CM

## 2019-06-07 NOTE — Progress Notes (Signed)
telephone Note  I connected with Nicole Herman  on 06/07/19 at  1:00 PM EDT by telephone and verified that I am speaking with the correct person using two identifiers.  Location patient: home Location provider:work or home office Persons participating in the virtual visit: patient, provider  I discussed the limitations of evaluation and management by telemedicine and the availability of in person appointments. The patient expressed understanding and agreed to proceed.   HPI: 1. C/o diarrhea x 2 weeks after being on doxycycline in 04/2019 for infection s/[ surgery she was supposed to be on Abx x 2 weeks but stopped after 1 and infection resolved. This am also c/o nausea  She has well water but drinks bottled water  She tried peptobismol which caused her stool to be black   This am she had 4 loose stringy stools but has also been having diarrhea at night and am  3. Mom in hospice in [place] so she may have to go there soon on morphine drip  4. HTN BP 04/27/19 128/82 pending stress test and echo with cardiology  5. HLD Dr. Clayborn Bigness wanted to change zocor to lipitor or crestor       ROS: See pertinent positives and negatives per HPI.  Past Medical History:  Diagnosis Date  . Airway polyps 2017  . Colon polyps   . Coronary artery disease    s/p carotid surgery left   . Does use hearing aid   . Fibromyalgia   . GERD (gastroesophageal reflux disease)   . Granuloma annulare   . Heart murmur   . History of chicken pox   . Hypertension   . Thyroid disease     Past Surgical History:  Procedure Laterality Date  . ABDOMINAL HYSTERECTOMY     1985  . CAROTID ENDARTERECTOMY     left   . CAROTID PTA/STENT INTERVENTION Right 03/22/2019   Procedure: CAROTID PTA/STENT INTERVENTION;  Surgeon: Katha Cabal, MD;  Location: [facility];  Service: Cardiovascular;  Laterality: Right;  . CARPAL TUNNEL RELEASE     x2  . [place]  . OOPHORECTOMY     1/2 ovaries  removed   . STAPEDES SURGERY     otosclerosis right   . TRIGGER FINGER RELEASE      Family History  Problem Relation Age of Onset  . Hypertension Mother   . Dementia Mother   . Arthritis Mother   . Hyperlipidemia Mother   . Cancer Father        colon   . Early death Father   . Learning disabilities Brother   . Colon polyps Brother   . Hyperlipidemia Brother   . Hypertension Brother   . Heart attack Maternal Grandmother   . Arthritis Maternal Grandmother   . Heart disease Maternal Grandmother   . Colon polyps Sister   . Lupus Sister   . Heart disease Maternal Grandfather   . Heart disease Paternal Grandmother   . Cancer Other     SOCIAL HX:  1 son and 1 grandchild in [place] in [place]  1 fiance of 14 years as of 03/15/2019 (with h/o alcoholism, cardiac defibrillator, liver failure)  Was CNA   Current Outpatient Medications:  .  albuterol (VENTOLIN HFA) 108 (90 Base) MCG/ACT inhaler, Inhale 2 puffs into the lungs every 6 (six) hours as needed for wheezing or shortness of breath. AND COUGH, Disp: 1 Inhaler, Rfl: 0 .  aspirin 81 MG chewable  tablet, Chew 81 mg by mouth daily. , Disp: , Rfl:  .  Calcium-Magnesium-Vitamin D (CALCIUM 1200+D3 PO), Take 1 tablet by mouth 2 (two) times a week., Disp: , Rfl:  .  clopidogrel (PLAVIX) 75 MG tablet, Take 1 tablet (75 mg total) by mouth daily., Disp: 90 tablet, Rfl: 3 .  diclofenac sodium (VOLTAREN) 1 % GEL, Apply 4 g topically 2 (two) times daily as needed (neck pain.). , Disp: , Rfl:  .  fluticasone (FLONASE) 50 MCG/ACT nasal spray, Place 1 spray into both nostrils daily., Disp: , Rfl:  .  levothyroxine (SYNTHROID) 75 MCG tablet, TAKE 1 TABLET(75 MCG) BY MOUTH DAILY BEFORE BREAKFAST. SKIP SUNDAYS, Disp: 90 tablet, Rfl: 3 .  pantoprazole (PROTONIX) 40 MG tablet, Take 1 tablet (40 mg total) by mouth 2 (two) times daily before a meal. 30 minutes before meal, Disp: 180 tablet, Rfl: 1 .  simvastatin (ZOCOR) 40 MG tablet, Take 1 tablet (40 mg  total) by mouth daily at 6 PM., Disp: 90 tablet, Rfl: 3 .  telmisartan (MICARDIS) 40 MG tablet, Take 0.5 tablets (20 mg total) by mouth daily. Take 1/2 tab po daily, Disp: 45 tablet, Rfl: 3  EXAM:  VITALS per patient if applicable:  GENERAL: alert, oriented, appears well and in no acute distress  HEENT: atraumatic, conjunttiva clear, no obvious abnormalities on inspection of external nose and ears  NECK: normal movements of the head and neck  LUNGS: on inspection no signs of respiratory distress, breathing rate appears normal, no obvious gross SOB, gasping or wheezing  CV: no obvious cyanosis  MS: moves all visible extremities without noticeable abnormality  PSYCH/NEURO: pleasant and cooperative, no obvious depression or anxiety, speech and thought processing grossly intact  ASSESSMENT AND PLAN:  Discussed the following assessment and plan:  Diarrhea, unspecified type - Plan: Clostridium Difficile by PCR([facility]), Gastrointestinal Panel by PCR , Stool -if w/u negative f/u with Dr. Marius Ditch due for EGD/colonoscopy  BRAT diet and hydration   Mixed hyperlipidemia- CC Dr. Clayborn Bigness he considered changing zocor 40 to lipitor or crestor  Essential hypertension -cont meds micardis 40 mg qd  BP was 128/82 04/27/19 cards appt pending echo and stress test per cards    HM Disc flu shot upcoming, Tdap, shingrix, pna 23   Mammogram 10/06/18 negative   Pap s/p hysterectomy   Colonoscopy rec get with EGD upcoming appt with Dr. Marius Ditch [facility] colon cancer in her dad   DEXA 10/06/18 osteoporosis T -2.9 consider prolia she does take calcium 1200 mg qd and 25 mcg vitamin D3  -on  prolia injections   Tobacco abuse -rec cessation   Consider A1C in future with other fasting labs after 07/26/2019   Former PCP Dr. Clayborn Bigness  ENT Dr. Pryor Ochoa [facility]  Dr. Clayborn Bigness cards  -pt saw 04/27/19 echo/NM stress test pending 05/29/19 and f/u 06/05/19 on  telmisartan 20 mg qd, protonix 40 mg bid, plavix 75 mg qd and changed zocor to a stronger alternative need to check with pharmacy to see which one [facility] in [place] his note is not complete yet as of 05/01/19    -we discussed possible serious and likely etiologies, options for evaluation and workup, limitations of telemedicine visit vs in person visit, treatment, treatment risks and precautions. Pt prefers to treat via telemedicine empirically rather then risking or undertaking an in person visit at this moment. Patient agrees to seek prompt in person care if worsening, new symptoms arise, or if is  not improving with treatment.   I discussed the assessment and treatment plan with the patient. The patient was provided an opportunity to ask questions and all were answered. The patient agreed with the plan and demonstrated an understanding of the instructions.   The patient was advised to call back or seek an in-person evaluation if the symptoms worsen or if the condition fails to improve as anticipated.  Time spent 20 minutes Delorise Jackson, MD

## 2019-06-07 NOTE — Progress Notes (Signed)
Tubes placed up front for pick up

## 2019-06-08 ENCOUNTER — Telehealth: Payer: Self-pay | Admitting: Internal Medicine

## 2019-06-08 ENCOUNTER — Telehealth: Payer: Self-pay | Admitting: Gastroenterology

## 2019-06-08 NOTE — Progress Notes (Signed)
Call pt needs stress test and echo and before can see Dr. Marius Ditch for procedures  If still having diarrhea stool testes negative  F/u Dr. Marius Ditch  [place]

## 2019-06-08 NOTE — Addendum Note (Signed)
Addended by: Orland Mustard on: 06/08/2019 02:47 PM   Modules accepted: Orders

## 2019-06-08 NOTE — Telephone Encounter (Signed)
Pt aware of message pt needs stress test and echo and before can see Dr. Marius Ditch for procedures  If still having diarrhea stool testes negative  F/u Dr. Marius Ditch   Pt states she canceled stress and echo due to her mom's health not good. She is not going to do anything until her family situation is resolved.  She will call back for stool results on Monday.

## 2019-06-08 NOTE — Telephone Encounter (Signed)
Left message for patient to return call back. P[facility] may give information.   Call pt needs stress test and echo and before can see Dr. Marius Ditch for procedures  If still having diarrhea stool testes negative  F/u Dr. Marius Ditch  [place]

## 2019-06-08 NOTE — Telephone Encounter (Signed)
-----   Message from Delorise Jackson, MD sent at 06/08/2019 12:52 PM EDT -----   ----- Message ----- From: Lin Landsman, MD Sent: 06/08/2019  12:31 PM EDT To: Nino Glow McLean-Scocuzza, MD, #  Yes, she does need egd/colon, She didn't show up on 9/8 I was waiting on cards/ent clearance She didn't even get a stress test as recommended by Cards I'm happy to see her  Tati  Can you call her and offer a follow up  Thanks RV ----- Message ----- From: McLean-Scocuzza, Nino Glow, MD Sent: 06/07/2019   4:08 PM EDT To: Lin Landsman, MD  Pleasee see note today pt still needs EGD/colonoscopy in future

## 2019-06-08 NOTE — Telephone Encounter (Signed)
-----   Message from Lin Landsman, MD sent at 06/08/2019 12:31 PM EDT ----- Yes, she does need egd/colon, She didn't show up on 9/8 I was waiting on cards/ent clearance She didn't even get a stress test as recommended by Cards I'm happy to see her  Tati  Can you call her and offer a follow up  Thanks RV ----- Message ----- From: McLean-Scocuzza, Nino Glow, MD Sent: 06/07/2019   4:08 PM EDT To: Lin Landsman, MD  Pleasee see note today pt still needs EGD/colonoscopy in future

## 2019-06-08 NOTE — Telephone Encounter (Signed)
Left vm to schedule f/u apt with Dr.Vanga

## 2019-06-09 ENCOUNTER — Other Ambulatory Visit
Admission: RE | Admit: 2019-06-09 | Discharge: 2019-06-09 | Disposition: A | Discharge status: Home / Self Care | Payer: BLUE CROSS/BLUE SHIELD | Source: Ambulatory Visit | Attending: Internal Medicine | Admitting: Internal Medicine

## 2019-06-09 DIAGNOSIS — R197 Diarrhea, unspecified: Secondary | ICD-10-CM | POA: Diagnosis present

## 2019-06-09 LAB — C DIFFICILE QUICK SCREEN W PCR REFLEX
C Diff antigen: NEGATIVE
C Diff interpretation: NOT DETECTED
C Diff toxin: NEGATIVE

## 2019-06-14 ENCOUNTER — Telehealth: Payer: Self-pay | Admitting: *Deleted

## 2019-06-14 LAB — GI PATHOGEN PANEL BY PCR, STOOL

## 2019-06-14 NOTE — Telephone Encounter (Signed)
GI panel was sent to [facility] from the hospital. Unsure as to why but they are awaiting for the results from them.

## 2019-06-14 NOTE — Telephone Encounter (Signed)
Copied from [place] [phone]. Topic: General - Other >> Jun 14, 2019 11:31 AM Carolyn Stare wrote: Pt call to inquire about a stool sample she dropped of at [facility], would like a call back

## 2019-06-14 NOTE — Telephone Encounter (Signed)
C diff negative  GI pathogen panel still pending  -Brock call hospital lab to f/u why still pending x 5 days  [place]

## 2019-06-14 NOTE — Telephone Encounter (Signed)
Patient was informed of one result. Patient is still have sx not as bad. Please advise.

## 2019-06-20 ENCOUNTER — Encounter: Payer: Self-pay | Admitting: Gastroenterology

## 2019-06-20 ENCOUNTER — Telehealth: Payer: Self-pay | Admitting: Gastroenterology

## 2019-06-20 NOTE — Telephone Encounter (Signed)
-----   Message from Lin Landsman, MD sent at 06/08/2019 12:31 PM EDT ----- Yes, she does need egd/colon, She didn't show up on 9/8 I was waiting on cards/ent clearance She didn't even get a stress test as recommended by Cards I'm happy to see her  Tati  Can you call her and offer a follow up  Thanks RV ----- Message ----- From: McLean-Scocuzza, Nino Glow, MD Sent: 06/07/2019   4:08 PM EDT To: Lin Landsman, MD  Pleasee see note today pt still needs EGD/colonoscopy in future

## 2019-06-20 NOTE — Telephone Encounter (Signed)
Left vm to offer apt with Dr. Marius Ditch per her note. Letter sent

## 2019-07-10 ENCOUNTER — Telehealth: Payer: Self-pay | Admitting: Gastroenterology

## 2019-07-10 NOTE — Telephone Encounter (Signed)
Patient received a letter to schedule an appointment . She is having surgery for poly removal in her throat by Dr Pryor Ochoa on 08-01-19. She has also had a stint placed & is on blood thinners. She will contact the office to schedule upper & lower endoscopies after the holidays.

## 2019-07-16 DIAGNOSIS — I6521 Occlusion and stenosis of right carotid artery: Secondary | ICD-10-CM

## 2019-07-16 HISTORY — DX: Occlusion and stenosis of right carotid artery: I65.21

## 2019-07-17 ENCOUNTER — Telehealth (INDEPENDENT_AMBULATORY_CARE_PROVIDER_SITE_OTHER): Payer: Self-pay

## 2019-07-18 NOTE — Telephone Encounter (Signed)
Disregard

## 2019-07-26 ENCOUNTER — Other Ambulatory Visit: Payer: No Typology Code available for payment source

## 2019-07-28 ENCOUNTER — Other Ambulatory Visit: Payer: Self-pay

## 2019-07-28 ENCOUNTER — Encounter
Admission: RE | Admit: 2019-07-28 | Discharge: 2019-07-28 | Disposition: A | Discharge status: Home / Self Care | Payer: BLUE CROSS/BLUE SHIELD | Source: Ambulatory Visit | Attending: Otolaryngology | Admitting: Otolaryngology

## 2019-07-28 DIAGNOSIS — Z01812 Encounter for preprocedural laboratory examination: Secondary | ICD-10-CM | POA: Diagnosis present

## 2019-07-28 DIAGNOSIS — Z20828 Contact with and (suspected) exposure to other viral communicable diseases: Secondary | ICD-10-CM | POA: Diagnosis not present

## 2019-07-28 HISTORY — DX: Other specified postprocedural states: Z98.890

## 2019-07-28 HISTORY — DX: Cardiac arrhythmia, unspecified: I49.9

## 2019-07-28 HISTORY — DX: Dyspnea, unspecified: R06.00

## 2019-07-28 HISTORY — DX: Anxiety disorder, unspecified: F41.9

## 2019-07-28 HISTORY — DX: Other specified postprocedural states: R11.2

## 2019-07-28 HISTORY — DX: Other complications of anesthesia, initial encounter: T88.59XA

## 2019-07-28 HISTORY — DX: Anemia, unspecified: D64.9

## 2019-07-28 LAB — CBC WITH DIFFERENTIAL/PLATELET
Abs Immature Granulocytes: 0.04 10*3/uL (ref 0.00–0.07)
Basophils Absolute: 0.1 10*3/uL (ref 0.0–0.1)
Basophils Relative: 1 %
Eosinophils Absolute: 0.3 10*3/uL (ref 0.0–0.5)
Eosinophils Relative: 3 %
HCT: 35.9 % — ABNORMAL LOW (ref 36.0–46.0)
Hemoglobin: 11.3 g/dL — ABNORMAL LOW (ref 12.0–15.0)
Immature Granulocytes: 0 %
Lymphocytes Relative: 23 %
Lymphs Abs: 2.1 10*3/uL (ref 0.7–4.0)
MCH: 24.8 pg — ABNORMAL LOW (ref 26.0–34.0)
MCHC: 31.5 g/dL (ref 30.0–36.0)
MCV: 78.7 fL — ABNORMAL LOW (ref 80.0–100.0)
Monocytes Absolute: 0.6 10*3/uL (ref 0.1–1.0)
Monocytes Relative: 7 %
Neutro Abs: 6 10*3/uL (ref 1.7–7.7)
Neutrophils Relative %: 66 %
Platelets: 202 10*3/uL (ref 150–400)
RBC: 4.56 MIL/uL (ref 3.87–5.11)
RDW: 16.7 % — ABNORMAL HIGH (ref 11.5–15.5)
WBC: 9.2 10*3/uL (ref 4.0–10.5)
nRBC: 0 % (ref 0.0–0.2)

## 2019-07-28 LAB — BASIC METABOLIC PANEL
Anion gap: 9 (ref 5–15)
BUN: 18 mg/dL (ref 8–23)
CO2: 25 mmol/L (ref 22–32)
Calcium: 9.2 mg/dL (ref 8.9–10.3)
Chloride: 107 mmol/L (ref 98–111)
Creatinine, Ser: 1.09 mg/dL — ABNORMAL HIGH (ref 0.44–1.00)
GFR calc Af Amer: >60 mL/min (ref >60)
GFR calc non Af Amer: 54 mL/min — ABNORMAL LOW (ref >60)
Glucose, Bld: 94 mg/dL (ref 70–99)
Potassium: 3.9 mmol/L (ref 3.5–5.1)
Sodium: 141 mmol/L (ref 135–145)

## 2019-07-28 LAB — SARS CORONAVIRUS 2 (TAT 6-24 HRS): SARS Coronavirus 2: NEGATIVE

## 2019-07-28 NOTE — Pre-Procedure Instructions (Addendum)
Requested a copy of Dr. Marianna Payment' visit with the patient in August.  He stated in that visit that patient would need an Echo and a stress test prior to having a colonoscopy/surgery.  Patient did not obtain these tests as of yet.  Reviewed history with Dr. Rosey Bath and he agreed that she needs the testing prior to surgery with Dr. Pryor Ochoa.  Left a voice message at ENT office stating the above.  I will also fax a note to their office stating the same.   Sent fax to Dr. Pryor Ochoa requesting their office to initiate this work up with patient and notifying her of cardiac clearance requirements.

## 2019-07-28 NOTE — Patient Instructions (Signed)
INSTRUCTIONS FOR SURGERY     Your surgery is scheduled for:   Tuesday, August 01, 2019     To find out your arrival time for the day of surgery,          please call [phone] between 1 pm and 3 pm on :  Monday, July 31, 2019     When you arrive for surgery, report to the [facility].       Do NOT stop on the first floor to register.    REMEMBER: Instructions that are not followed completely may result in serious medical risk,  up to and including death, or upon the discretion of your surgeon and anesthesiologist,            your surgery may need to be rescheduled.  __X__ 1. Do not eat food after midnight the night before your procedure.                    No gum, candy, lozenger, tic tacs, tums or hard candies.                  ABSOLUTELY NOTHING SOLID IN YOUR MOUTH AFTER MIDNIGHT                    You may drink unlimited clear liquids up to 2 hours before you are scheduled to arrive for surgery.                   Do not drink anything within those 2 hours unless you need to take medicine, then take the                   smallest amount you need.  Clear liquids include:  water, apple juice without pulp,                   any flavor Gatorade, Black coffee, black tea.  Sugar may be added but no dairy/ honey /lemon.                        Broth and jello is not considered a clear liquid.  __x__  2. On the morning of surgery, please brush your teeth with toothpaste and water. You may rinse with                  mouthwash if you wish but DO NOT SWALLOW TOOTHPASTE OR MOUTHWASH  __X___3. NO alcohol for 24 hours before or after surgery.  __x___ 4.  Do NOT smoke or use e-cigarettes for 24 HOURS PRIOR TO SURGERY.                      DO NOT Use any chewable tobacco products for at least 6 hours prior to surgery.  __x___ 5. If you start any new medication after this appointment and prior to surgery, please                  Bring it with you on the day of surgery.  ___x__ 6. Notify your doctor if there is any change in  your medical condition, such as fever, infection, vomitting,                   Diarrhea or any open sores.  __x___ 7.  USE the CHG SOAP as instructed, the night before surgery and the day of surgery.                   Once you have washed with this soap, do NOT use any of the following: Powders, perfumes                    or lotions. Please do not wear make up, hairpins, clips or nail polish. You MAY wear deodorant.                   Men may shave their face and neck.  Women need to shave 48 hours prior to surgery.                   DO NOT wear ANY jewelry on the day of surgery. If there are rings that are too tight to                    remove easily, please address this prior to the surgery day. Piercings need to be removed.                                                                     NO METAL ON YOUR BODY.                    Do NOT bring any valuables.  If you came to Pre-Admit testing then you will not need license,                     insurance card or credit card.  If you will be staying overnight, please either leave your things in                     the car or have your family be responsible for these items.                     [facility] IS NOT RESPONSIBLE FOR BELONGINGS OR VALUABLES.  ___X__ 8. DO NOT wear contact lenses on surgery day.  You may not have dentures,                     Hearing aides, contacts or glasses in the operating room. These items can be                    Placed in the Recovery Room to receive immediately after surgery.  __x___ 9. IF YOU ARE SCHEDULED TO GO HOME ON THE SAME DAY, YOU MUST                   Have someone to drive you home and to stay with you  for the first 24 hours.                    Have an arrangement prior to arriving on surgery day.  ___x__ 10. Take the following medications on  the morning of surgery with a sip of  water:                              1. SYNTHROID                     2. PROTONIX                     3. ESTRACE                     4.                     5.                     6.  _____ 11.  Follow any instructions provided to you by your surgeon.                        Such as enema, clear liquid bowel prep  __X__  12. STOP  PLAVIX  AND ASPIRIN AS OF: November 8TH                       THIS INCLUDES BC POWDERS / GOODIES POWDER  __x___ 13. STOP Anti-inflammatories as of:  TODAY                      This includes IBUPROFEN / MOTRIN / ADVIL / ALEVE/ NAPROXYN                    YOU MAY TAKE TYLENOL ANY TIME PRIOR TO SURGERY.  _____ 14.  Stop supplements until after surgery.                     This includes:  N/A                 You may continue taking Vitamin B12 / Vitamin D3 but do not take on the morning of surgery.  _____ 15. Bring your CPAP machine into preop with you on the morning of surgery.  ______17.  Continue to take the following medications but do not take on the morning of surgery:                           MICARDIS  ______18. If staying overnight, please have appropriate shoes to wear to be able to walk around the unit.                   Wear clean and comfortable clothing to the hospital.

## 2019-08-01 ENCOUNTER — Ambulatory Visit
Admission: RE | Admit: 2019-08-01 | Payer: No Typology Code available for payment source | Source: Home / Self Care | Admitting: Otolaryngology

## 2019-08-01 ENCOUNTER — Encounter: Admission: RE | Payer: Self-pay | Source: Home / Self Care

## 2019-08-01 ENCOUNTER — Telehealth: Payer: Self-pay | Admitting: *Deleted

## 2019-08-01 SURGERY — MICROLARYNGOSCOPY
Anesthesia: General | Laterality: Bilateral

## 2019-08-01 NOTE — Telephone Encounter (Signed)
Copied from [place] [phone]. Topic: General - Other >> Aug 01, 2019  8:59 AM Keene Breath wrote: Reason for CRM: Patient called to inform the doctor that her surgery has been cancelled.  She stated that she has to have a stress test and Eco before the surgery.  She did have a pre-op where her labs were drawn.  She will inform you of the new date for the surgery as soon as her tests come back cleared.

## 2019-08-22 ENCOUNTER — Ambulatory Visit: Payer: No Typology Code available for payment source | Admitting: Internal Medicine

## 2019-09-05 ENCOUNTER — Telehealth: Payer: Self-pay | Admitting: Internal Medicine

## 2019-09-05 NOTE — Telephone Encounter (Signed)
Lm on vm to call office to update insurance on file.

## 2019-10-27 ENCOUNTER — Telehealth: Payer: Self-pay | Admitting: Internal Medicine

## 2019-10-27 NOTE — Telephone Encounter (Signed)
Patient due for Prolia injection called to schedule, no answer left message please schedule nurse visit.

## 2019-10-31 NOTE — Telephone Encounter (Signed)
second attempt to reach patient to schedule for Prolia no answer left detailed message to call and schedule Prolia injection.

## 2019-11-06 ENCOUNTER — Ambulatory Visit (INDEPENDENT_AMBULATORY_CARE_PROVIDER_SITE_OTHER): Payer: No Typology Code available for payment source

## 2019-11-06 ENCOUNTER — Ambulatory Visit (INDEPENDENT_AMBULATORY_CARE_PROVIDER_SITE_OTHER): Payer: No Typology Code available for payment source | Admitting: Vascular Surgery

## 2019-11-06 ENCOUNTER — Other Ambulatory Visit: Payer: Self-pay

## 2019-11-06 ENCOUNTER — Encounter (INDEPENDENT_AMBULATORY_CARE_PROVIDER_SITE_OTHER): Payer: Self-pay | Admitting: Vascular Surgery

## 2019-11-06 ENCOUNTER — Encounter (INDEPENDENT_AMBULATORY_CARE_PROVIDER_SITE_OTHER): Payer: Self-pay

## 2019-11-06 VITALS — BP 132/70 | HR 71 | Resp 17 | Ht 64.0 in | Wt 139.0 lb

## 2019-11-06 DIAGNOSIS — J449 Chronic obstructive pulmonary disease, unspecified: Secondary | ICD-10-CM

## 2019-11-06 DIAGNOSIS — I6521 Occlusion and stenosis of right carotid artery: Secondary | ICD-10-CM | POA: Diagnosis not present

## 2019-11-06 DIAGNOSIS — I6523 Occlusion and stenosis of bilateral carotid arteries: Secondary | ICD-10-CM | POA: Diagnosis not present

## 2019-11-06 DIAGNOSIS — I10 Essential (primary) hypertension: Secondary | ICD-10-CM

## 2019-11-06 DIAGNOSIS — E782 Mixed hyperlipidemia: Secondary | ICD-10-CM | POA: Diagnosis not present

## 2019-11-06 NOTE — Progress Notes (Signed)
MRN : [id number]  Nicole Herman is a 64 y.o. (1956-03-03) female who presents with chief complaint of No chief complaint on file. Marland Kitchen  History of Present Illness:   The patient is status post a left CEA on Jan 25, 2011 and placement of a 9 mm x 7 mm x 30 mm exact stent with the use of the NAV-6 embolic protection device in the right carotid artery    There were no post operative problems or complications related to the surgery.  The patient denies neck or incisional pain.  The patient denies interval amaurosis fugax. There is no recent history of TIA symptoms or focal motor deficits. There is no prior documented CVA.  The patient denies headache.  The patient is taking enteric-coated aspirin 81 mg daily.  The patient has a history of coronary artery disease, no recent episodes of angina or shortness of breath. The patient denies PAD or claudication symptoms. There is a history of hyperlipidemia which is being treated with a statin.   Carotid duplex today shows bilateral <40% ICA stenosis  No outpatient medications have been marked as taking for the 11/06/19 encounter (Appointment) with Delana Meyer, Dolores Lory, MD.    Past Medical History:  Diagnosis Date  . Airway polyps 2017  . Anemia   . Anxiety   . Carotid artery stenosis, unilateral, right 07/2019  . Colon polyps   . Complication of anesthesia   . Coronary artery disease    s/p carotid surgery left   . Does use hearing aid   . Dyspnea    d/t vocal cord polyps  . Dysrhythmia    bradycardia when in hospital getting stent placed  . Fibromyalgia   . GERD (gastroesophageal reflux disease)   . Granuloma annulare   . Heart murmur    born with it, no treatment  . History of chicken pox   . Hypertension   . PONV (postoperative nausea and vomiting)   . Thyroid disease     Past Surgical History:  Procedure Laterality Date  . ABDOMINAL HYSTERECTOMY     1985  . CAROTID ENDARTERECTOMY     left   . CAROTID PTA/STENT  INTERVENTION Right 03/22/2019   Procedure: CAROTID PTA/STENT INTERVENTION;  Surgeon: Katha Cabal, MD;  Location: [facility];  Service: Cardiovascular;  Laterality: Right;  . CARPAL TUNNEL RELEASE     x2  . [place]  . OOPHORECTOMY     1/2 ovaries removed   . STAPEDES SURGERY     otosclerosis right .  prostetic in right.  Marland Kitchen TRIGGER FINGER RELEASE      Social History Social History   Tobacco Use  . Smoking status: Current Every Day Smoker    Packs/day: 1.00    Types: Cigarettes  . Smokeless tobacco: Never Used  Substance Use Topics  . Alcohol use: No  . Drug use: No    Family History Family History  Problem Relation Age of Onset  . Hypertension Mother   . Dementia Mother   . Arthritis Mother   . Hyperlipidemia Mother   . Cancer Father        colon   . Early death Father   . Learning disabilities Brother   . Colon polyps Brother   . Hyperlipidemia Brother   . Hypertension Brother   . Heart attack Maternal Grandmother   . Arthritis Maternal Grandmother   . Heart disease Maternal Grandmother   . Colon  polyps Sister   . Lupus Sister   . Heart disease Maternal Grandfather   . Heart disease Paternal Grandmother   . Cancer Other     Allergies  Allergen Reactions  . Chantix [Varenicline Tartrate]     Nightmares    . Penicillins Rash    Did it involve swelling of the face/tongue/throat, SOB, or low BP? No Did it involve sudden or severe rash/hives, skin peeling, or any reaction on the inside of your mouth or nose? Yes Did you need to seek medical attention at a hospital or doctor's office? Yes When did it last happen?3-4 years ago If all above answers are "NO", may proceed with cephalosporin use.   . Wellbutrin [Bupropion]     Did not like the way it made her feel: "too wired."      REVIEW OF SYSTEMS (Negative unless checked)  Constitutional: [] Weight loss  [] Fever  [] Chills Cardiac: [] Chest pain   [] Chest pressure    [] Palpitations   [] Shortness of breath when laying flat   [] Shortness of breath with exertion. Vascular:  [] Pain in legs with walking   [] Pain in legs at rest  [] History of DVT   [] Phlebitis   [] Swelling in legs   [] Varicose veins   [] Non-healing ulcers Pulmonary:   [] Uses home oxygen   [] Productive cough   [] Hemoptysis   [] Wheeze  [] COPD   [] Asthma Neurologic:  [] Dizziness   [] Seizures   [] History of stroke   [] History of TIA  [] Aphasia   [] Vissual changes   [] Weakness or numbness in arm   [] Weakness or numbness in leg Musculoskeletal:   [] Joint swelling   [] Joint pain   [] Low back pain Hematologic:  [] Easy bruising  [] Easy bleeding   [] Hypercoagulable state   [] Anemic Gastrointestinal:  [] Diarrhea   [] Vomiting  [] Gastroesophageal reflux/heartburn   [] Difficulty swallowing. Genitourinary:  [] Chronic kidney disease   [] Difficult urination  [] Frequent urination   [] Blood in urine Skin:  [] Rashes   [] Ulcers  Psychological:  [] History of anxiety   []  History of major depression.  Physical Examination  There were no vitals filed for this visit. There is no height or weight on file to calculate BMI. Gen: WD/WN, NAD Head: [place]/AT, No temporalis wasting.  Ear/Nose/Throat: Hearing grossly intact, nares w/o erythema or drainage Eyes: PER, EOMI, sclera nonicteric.  Neck: Supple, no large masses.   Pulmonary:  Good air movement, no audible wheezing bilaterally, no use of accessory muscles.  Cardiac: RRR, no JVD Vascular:  Left CEA incisional scar Vessel Right Left  Radial Palpable Palpable  Brachial Palpable Palpable  Carotid Palpable Palpable  Gastrointestinal: Non-distended. No guarding/no peritoneal signs.  Musculoskeletal: M/S 5/5 throughout.  No deformity or atrophy.  Neurologic: CN 2-12 intact. Symmetrical.  Speech is fluent. Motor exam as listed above. Psychiatric: Judgment intact, Mood & affect appropriate for pt's clinical situation. Dermatologic: No rashes or ulcers noted.  No changes  consistent with cellulitis.  CBC Lab Results  Component Value Date   WBC 9.2 07/28/2019   HGB 11.3 (L) 07/28/2019   HCT 35.9 (L) 07/28/2019   MCV 78.7 (L) 07/28/2019   PLT 202 07/28/2019    BMET    Component Value Date/Time   NA 141 07/28/2019 1146   NA 139 09/27/2018 0922   NA 139 12/14/2011 0731   K 3.9 07/28/2019 1146   K 4.5 12/14/2011 0731   CL 107 07/28/2019 1146   CL 103 12/14/2011 0731   CO2 25 07/28/2019 1146   CO2 30 12/14/2011  0731   GLUCOSE 94 07/28/2019 1146   GLUCOSE 126 (H) 12/14/2011 0731   BUN 18 07/28/2019 1146   BUN 15 09/27/2018 0922   BUN 14 12/14/2011 0731   CREATININE 1.09 (H) 07/28/2019 1146   CREATININE 1.03 12/14/2011 0731   CALCIUM 9.2 07/28/2019 1146   CALCIUM 9.1 12/14/2011 0731   GFRNONAA 54 (L) 07/28/2019 1146   GFRNONAA 59 (L) 12/14/2011 0731   GFRAA >60 07/28/2019 1146   GFRAA >60 12/14/2011 0731   CrCl cannot be calculated (Patient's most recent lab result is older than the maximum 21 days allowed.).  COAG No results found for: INR, PROTIME  Radiology No results found.  Assessment/Plan 1. Bilateral carotid artery stenosis Recommend:  Given the patient's asymptomatic subcritical stenosis no further invasive testing or surgery at this time.  Duplex ultrasound shows <30% stenosis bilaterally, widely patent right stent and left CEA.  Continue antiplatelet therapy as prescribed Continue management of CAD, HTN and Hyperlipidemia Healthy heart diet,  encouraged exercise at least 4 times per week Follow up in 6 months with duplex ultrasound and physical exam   It is okay to stop her Plavix temporarily.  Consequently she may proceed with the removal of her vocal cord polyps.  I would restart the Plavix as soon as possible after these procedures.  - VAS US CAROTID; Future  2. Essential hypertension Continue antihypertensive medications as already ordered, these medications have been reviewed and there are no changes at this  time.   3. Chronic obstructive pulmonary disease, unspecified COPD type ([place]) Continue pulmonary medications and aerosols as already ordered, these medications have been reviewed and there are no changes at this time.    4. Mixed hyperlipidemia Continue statin as ordered and reviewed, no changes at this time    Hortencia Pilar, MD  11/06/2019 10:12 AM

## 2019-11-17 NOTE — Telephone Encounter (Signed)
Left detailed message per DPR for patient to call and schedule prolia injection, any questions please forward.

## 2019-12-20 ENCOUNTER — Other Ambulatory Visit: Payer: Self-pay | Admitting: Sports Medicine

## 2019-12-20 DIAGNOSIS — M7541 Impingement syndrome of right shoulder: Secondary | ICD-10-CM

## 2019-12-20 DIAGNOSIS — D1721 Benign lipomatous neoplasm of skin and subcutaneous tissue of right arm: Secondary | ICD-10-CM

## 2019-12-20 DIAGNOSIS — G8929 Other chronic pain: Secondary | ICD-10-CM

## 2019-12-25 ENCOUNTER — Other Ambulatory Visit: Payer: Self-pay | Admitting: Internal Medicine

## 2019-12-25 ENCOUNTER — Telehealth: Payer: Self-pay

## 2019-12-25 DIAGNOSIS — J431 Panlobular emphysema: Secondary | ICD-10-CM | POA: Insufficient documentation

## 2019-12-25 DIAGNOSIS — I6523 Occlusion and stenosis of bilateral carotid arteries: Secondary | ICD-10-CM | POA: Insufficient documentation

## 2019-12-25 DIAGNOSIS — K219 Gastro-esophageal reflux disease without esophagitis: Secondary | ICD-10-CM | POA: Insufficient documentation

## 2019-12-25 DIAGNOSIS — Z1231 Encounter for screening mammogram for malignant neoplasm of breast: Secondary | ICD-10-CM

## 2019-12-25 DIAGNOSIS — D508 Other iron deficiency anemias: Secondary | ICD-10-CM | POA: Insufficient documentation

## 2019-12-25 NOTE — Telephone Encounter (Signed)
Lin Landsman, MD  Shelby Mattocks, CMA; Heath Lark T  I need to see her in next 2 weeks, Urgent  Dx: IDA   RV   Tried to call patient but mailbox is full unable to leave a message

## 2019-12-25 NOTE — Telephone Encounter (Signed)
Tried to call patient but mailbox is full  

## 2019-12-26 NOTE — Telephone Encounter (Signed)
Tried to call patient mailbox is full

## 2019-12-26 NOTE — Telephone Encounter (Signed)
Tried to call patient but mailbox is full  

## 2019-12-26 NOTE — Telephone Encounter (Signed)
Mailed letter to patient since unable to reach Patient

## 2020-01-08 ENCOUNTER — Ambulatory Visit
Admission: RE | Admit: 2020-01-08 | Discharge: 2020-01-08 | Disposition: A | Discharge status: Home / Self Care | Payer: BLUE CROSS/BLUE SHIELD | Source: Ambulatory Visit | Attending: Sports Medicine | Admitting: Sports Medicine

## 2020-01-08 ENCOUNTER — Other Ambulatory Visit: Payer: Self-pay

## 2020-01-08 DIAGNOSIS — G8929 Other chronic pain: Secondary | ICD-10-CM | POA: Diagnosis present

## 2020-01-08 DIAGNOSIS — M25511 Pain in right shoulder: Secondary | ICD-10-CM | POA: Insufficient documentation

## 2020-01-08 DIAGNOSIS — D1721 Benign lipomatous neoplasm of skin and subcutaneous tissue of right arm: Secondary | ICD-10-CM | POA: Diagnosis present

## 2020-01-08 DIAGNOSIS — M7541 Impingement syndrome of right shoulder: Secondary | ICD-10-CM | POA: Diagnosis present

## 2020-02-13 ENCOUNTER — Encounter: Payer: Self-pay | Admitting: Gastroenterology

## 2020-02-13 ENCOUNTER — Other Ambulatory Visit: Payer: Self-pay

## 2020-02-13 ENCOUNTER — Ambulatory Visit: Payer: No Typology Code available for payment source | Admitting: Gastroenterology

## 2020-02-13 VITALS — BP 155/68 | HR 76 | Temp 98.1°F | Ht 64.0 in | Wt 140.1 lb

## 2020-02-13 DIAGNOSIS — D500 Iron deficiency anemia secondary to blood loss (chronic): Secondary | ICD-10-CM | POA: Diagnosis not present

## 2020-02-13 MED ORDER — NA SULFATE-K SULFATE-MG SULF 17.5-3.13-1.6 GM/177ML PO SOLN: 354.0000 mL | Freq: Once | ORAL | 0 refills | Status: AC

## 2020-02-13 NOTE — Progress Notes (Signed)
Nicole Darby, MD [address]  [place]  [place], [place] [address]  Main: [phone]  Fax: [phone]    Gastroenterology Consultation  Referring Provider:     Idelle Crouch, MD Primary Care Physician:  Idelle Crouch, MD Primary Gastroenterologist:  Dr. Cephas Herman Reason for Consultation:     Reflux, iron deficiency anemia        HPI:   Nicole Herman is a 64 y.o. female referred by Dr. Doy Herman, Nicole Douglas, MD  for consultation & management of reflux.  Patient reports she has been experiencing symptoms of regurgitation, burning in her chest, intermittent, sporadic left upper quadrant pain radiating to lower back.  She reports that her reflux symptoms have been ongoing for several years, has been maintained on Protonix 40 mg twice daily which keeps her symptoms under control.  She is followed by ENT, Dr. Pryor Herman for airway polyps.  She tried to switch to a different proton pump inhibitor, was not effective.  She also underwent carotid artery stent placement last month and has been on Plavix since then.  She is currently dealing with intermittent episodes of lightheadedness, dizziness with intermittent bradycardia for which she is seeing Dr. Clayborn Herman.  She is an active smoker.  She denies black stools, blood in the stools, epigastric pain, nausea or vomiting.  Her last hemoglobin was 10.1 about a month ago, normal MCV.  She does not have chronic liver disease. She denies weight loss.  She does have history of colon polyps and underwent colonoscopy several years ago.  Follow-up visit 02/13/2020 Patient underwent removal of vocal cord polyps.  She did not follow-up regarding upper endoscopy and colonoscopy for iron deficiency anemia.  She is here today to discuss about procedures.  She is currently on over-the-counter iron 65 mg 2 times daily resulting in dark stools.  Her most recent hemoglobin was 9.7 on 12/27/2019, MCV 78.1, ferritin 5, normal TSH.  She does report  heartburn for which she is taking Protonix 2 times a day before meals.   NSAIDs: None  Antiplts/Anticoagulants/Anti thrombotics: None  GI Procedures: Colonoscopy more than 10 years ago, found to have colon polyps  Past Medical History:  Diagnosis Date  . Airway polyps 2017  . Anemia   . Anxiety   . Carotid artery stenosis, unilateral, right 07/2019  . Colon polyps   . Complication of anesthesia   . Coronary artery disease    s/p carotid surgery left   . Does use hearing aid   . Dyspnea    d/t vocal cord polyps  . Dysrhythmia    bradycardia when in hospital getting stent placed  . Fibromyalgia   . GERD (gastroesophageal reflux disease)   . Granuloma annulare   . Heart murmur    born with it, no treatment  . History of chicken pox   . Hypertension   . PONV (postoperative nausea and vomiting)   . Thyroid disease     Past Surgical History:  Procedure Laterality Date  . ABDOMINAL HYSTERECTOMY     1985  . CAROTID ENDARTERECTOMY     left   . CAROTID PTA/STENT INTERVENTION Right 03/22/2019   Procedure: CAROTID PTA/STENT INTERVENTION;  Surgeon: Katha Cabal, MD;  Location: [facility];  Service: Cardiovascular;  Laterality: Right;  . CARPAL TUNNEL RELEASE     x2  . [place]  . OOPHORECTOMY     1/2 ovaries removed   .  STAPEDES SURGERY     otosclerosis right .  prostetic in right.  Marland Kitchen TRIGGER FINGER RELEASE      Current Outpatient Medications:  .  aspirin 81 MG chewable tablet, Chew 81 mg by mouth daily. , Disp: , Rfl:  .  atorvastatin (LIPITOR) 40 MG tablet, Take 10 mg by mouth daily at 6 PM. , Disp: , Rfl:  .  clopidogrel (PLAVIX) 75 MG tablet, Take 1 tablet (75 mg total) by mouth daily., Disp: 90 tablet, Rfl: 3 .  levothyroxine (SYNTHROID) 75 MCG tablet, TAKE 1 TABLET(75 MCG) BY MOUTH DAILY BEFORE BREAKFAST. SKIP SUNDAYS (Patient taking differently: Take 75 mcg by mouth See admin instructions. TAKE 75 MCG BY MOUTH DAILY BEFORE BREAKFAST  SKIPPING SUNDAYS), Disp: 90 tablet, Rfl: 3 .  nicotine (NICODERM CQ - DOSED IN MG/24 HOURS) 21 mg/24hr patch, Place 21 mg onto the skin daily., Disp: , Rfl:  .  pantoprazole (PROTONIX) 40 MG tablet, Take 1 tablet (40 mg total) by mouth 2 (two) times daily before a meal. 30 minutes before meal, Disp: 180 tablet, Rfl: 1 .  telmisartan (MICARDIS) 40 MG tablet, Take 0.5 tablets (20 mg total) by mouth daily. Take 1/2 tab po daily (Patient taking differently: Take 20 mg by mouth daily. ), Disp: 45 tablet, Rfl: 3 .  Na Sulfate-K Sulfate-Mg Sulf 17.5-3.13-1.6 GM/177ML SOLN, Take 354 mLs by mouth once for 1 dose., Disp: 354 mL, Rfl: 0    Family History  Problem Relation Age of Onset  . Hypertension Mother   . Dementia Mother   . Arthritis Mother   . Hyperlipidemia Mother   . Cancer Father        colon   . Early death Father   . Learning disabilities Brother   . Colon polyps Brother   . Hyperlipidemia Brother   . Hypertension Brother   . Heart attack Maternal Grandmother   . Arthritis Maternal Grandmother   . Heart disease Maternal Grandmother   . Colon polyps Sister   . Lupus Sister   . Heart disease Maternal Grandfather   . Heart disease Paternal Grandmother   . Cancer Other      Social History   Tobacco Use  . Smoking status: Current Every Day Smoker    Packs/day: 1.00    Types: Cigarettes  . Smokeless tobacco: Never Used  Substance Use Topics  . Alcohol use: No  . Drug use: No    Allergies as of 02/13/2020 - Review Complete 02/13/2020  Allergen Reaction Noted  . Chantix [varenicline tartrate]  03/15/2019  . Atorvastatin Other (See Comments) 09/26/2019  . Penicillins Rash 09/16/2017  . Wellbutrin [bupropion]  03/15/2019    Review of Systems:    All systems reviewed and negative except where noted in HPI.   Physical Exam:  BP (!) 155/68 (BP Location: Left Arm, Patient Position: Sitting, Cuff Size: Normal)   Pulse 76   Temp 98.1 F (36.7 C) (Oral)   Ht 5\' 4"  (1.626  m)   Wt 140 lb 2 oz (63.6 kg)   BMI 24.05 kg/m  No LMP recorded. Patient has had a hysterectomy.  General:   Alert,  Well-developed, well-nourished, pleasant and cooperative in NAD Head:  Normocephalic and atraumatic. Eyes:  Sclera clear, no icterus.   Conjunctiva pink. Ears:  Normal auditory acuity. Nose:  No deformity, discharge, or lesions. Mouth:  No deformity or lesions,oropharynx pink & moist. Neck:  Supple; no masses or thyromegaly. Lungs:  Respirations even and unlabored.  Clear  throughout to auscultation.   No wheezes, crackles, or rhonchi. No acute distress. Heart:  Regular rate and rhythm; no murmurs, clicks, rubs, or gallops. Abdomen:  Normal bowel sounds. Soft, non-tender and non-distended without masses, hepatosplenomegaly or hernias noted.  No guarding or rebound tenderness.   Rectal: Not performed Msk:  Symmetrical without gross deformities. Good, equal movement & strength bilaterally. Pulses:  Normal pulses noted. Extremities:  No clubbing or edema.  No cyanosis. Neurologic:  Alert and oriented x3;  grossly normal neurologically. Skin:  Intact without significant lesions or rashes. No jaundice. Psych:  Alert and cooperative. Normal mood and affect.  Imaging Studies: No abdominal imaging  Assessment and Plan:   JONIYAH WIXOM is a 64 y.o. female with chronic tobacco use, carotid artery stenosis, left carotid endarterectomy, right carotid artery stent placement on Plavix, symptomatic bradycardia is seen for follow-up of chronic GERD and iron deficiency anemia  Chronic GERD Continue Protonix 40 mg twice daily before meals Continue antireflux measures, avoid food triggers H. pylori breath test was negative  Severe iron deficiency anemia Recommend upper endoscopy as well as colonoscopy +/- VCE Advised patient to be off Plavix for 5 days as well as discontinue oral iron for 5 days before the procedure   Follow up in 3 months   Nicole Darby, MD

## 2020-02-26 ENCOUNTER — Ambulatory Visit
Admission: RE | Admit: 2020-02-26 | Discharge: 2020-02-26 | Disposition: A | Discharge status: Home / Self Care | Payer: BLUE CROSS/BLUE SHIELD | Source: Ambulatory Visit | Attending: Internal Medicine | Admitting: Internal Medicine

## 2020-02-26 DIAGNOSIS — Z1231 Encounter for screening mammogram for malignant neoplasm of breast: Secondary | ICD-10-CM | POA: Diagnosis present

## 2020-03-04 ENCOUNTER — Other Ambulatory Visit: Payer: Self-pay

## 2020-03-04 ENCOUNTER — Other Ambulatory Visit
Admission: RE | Admit: 2020-03-04 | Discharge: 2020-03-04 | Disposition: A | Discharge status: Home / Self Care | Payer: BLUE CROSS/BLUE SHIELD | Source: Ambulatory Visit | Attending: Gastroenterology | Admitting: Gastroenterology

## 2020-03-04 DIAGNOSIS — Z20822 Contact with and (suspected) exposure to covid-19: Secondary | ICD-10-CM | POA: Insufficient documentation

## 2020-03-04 DIAGNOSIS — Z01812 Encounter for preprocedural laboratory examination: Secondary | ICD-10-CM | POA: Insufficient documentation

## 2020-03-05 ENCOUNTER — Telehealth: Payer: Self-pay

## 2020-03-05 LAB — SARS CORONAVIRUS 2 (TAT 6-24 HRS): SARS Coronavirus 2: NEGATIVE

## 2020-03-05 NOTE — Telephone Encounter (Signed)
Patient called and left a message for call back on 02/26/2020 because she had some questions about her upcoming colonoscopy. Called and left a message for call back on 02/26/2020. Patient called and left a message on 03/05/2020 and left a message. Called and left a message again

## 2020-03-06 ENCOUNTER — Encounter
Admission: RE | Disposition: A | Discharge status: Home / Self Care | Payer: Self-pay | Source: Home / Self Care | Attending: Gastroenterology

## 2020-03-06 ENCOUNTER — Ambulatory Visit: Payer: BLUE CROSS/BLUE SHIELD | Admitting: Certified Registered"

## 2020-03-06 ENCOUNTER — Ambulatory Visit
Admission: RE | Admit: 2020-03-06 | Discharge: 2020-03-06 | Disposition: A | Discharge status: Home / Self Care | Payer: BLUE CROSS/BLUE SHIELD | Attending: Gastroenterology | Admitting: Gastroenterology

## 2020-03-06 ENCOUNTER — Encounter: Payer: Self-pay | Admitting: Gastroenterology

## 2020-03-06 ENCOUNTER — Other Ambulatory Visit: Payer: Self-pay

## 2020-03-06 DIAGNOSIS — M797 Fibromyalgia: Secondary | ICD-10-CM | POA: Diagnosis not present

## 2020-03-06 DIAGNOSIS — K2270 Barrett's esophagus without dysplasia: Secondary | ICD-10-CM | POA: Diagnosis not present

## 2020-03-06 DIAGNOSIS — E039 Hypothyroidism, unspecified: Secondary | ICD-10-CM | POA: Diagnosis not present

## 2020-03-06 DIAGNOSIS — K228 Other specified diseases of esophagus: Secondary | ICD-10-CM | POA: Diagnosis not present

## 2020-03-06 DIAGNOSIS — K319 Disease of stomach and duodenum, unspecified: Secondary | ICD-10-CM | POA: Diagnosis not present

## 2020-03-06 DIAGNOSIS — Z7982 Long term (current) use of aspirin: Secondary | ICD-10-CM | POA: Insufficient documentation

## 2020-03-06 DIAGNOSIS — D500 Iron deficiency anemia secondary to blood loss (chronic): Secondary | ICD-10-CM

## 2020-03-06 DIAGNOSIS — Z79899 Other long term (current) drug therapy: Secondary | ICD-10-CM | POA: Insufficient documentation

## 2020-03-06 DIAGNOSIS — K635 Polyp of colon: Secondary | ICD-10-CM | POA: Diagnosis not present

## 2020-03-06 DIAGNOSIS — K219 Gastro-esophageal reflux disease without esophagitis: Secondary | ICD-10-CM | POA: Diagnosis not present

## 2020-03-06 DIAGNOSIS — I493 Ventricular premature depolarization: Secondary | ICD-10-CM | POA: Diagnosis not present

## 2020-03-06 DIAGNOSIS — Z79890 Hormone replacement therapy (postmenopausal): Secondary | ICD-10-CM | POA: Diagnosis not present

## 2020-03-06 DIAGNOSIS — I10 Essential (primary) hypertension: Secondary | ICD-10-CM | POA: Insufficient documentation

## 2020-03-06 DIAGNOSIS — D509 Iron deficiency anemia, unspecified: Secondary | ICD-10-CM | POA: Diagnosis not present

## 2020-03-06 DIAGNOSIS — Z974 Presence of external hearing-aid: Secondary | ICD-10-CM | POA: Diagnosis not present

## 2020-03-06 DIAGNOSIS — D120 Benign neoplasm of cecum: Secondary | ICD-10-CM | POA: Insufficient documentation

## 2020-03-06 DIAGNOSIS — F419 Anxiety disorder, unspecified: Secondary | ICD-10-CM | POA: Diagnosis not present

## 2020-03-06 DIAGNOSIS — F17210 Nicotine dependence, cigarettes, uncomplicated: Secondary | ICD-10-CM | POA: Insufficient documentation

## 2020-03-06 DIAGNOSIS — I2510 Atherosclerotic heart disease of native coronary artery without angina pectoris: Secondary | ICD-10-CM | POA: Diagnosis not present

## 2020-03-06 DIAGNOSIS — Z7902 Long term (current) use of antithrombotics/antiplatelets: Secondary | ICD-10-CM | POA: Diagnosis not present

## 2020-03-06 HISTORY — PX: COLONOSCOPY WITH PROPOFOL: SHX5780

## 2020-03-06 HISTORY — PX: ESOPHAGOGASTRODUODENOSCOPY (EGD) WITH PROPOFOL: SHX5813

## 2020-03-06 SURGERY — COLONOSCOPY WITH PROPOFOL
Anesthesia: General

## 2020-03-06 MED ORDER — LIDOCAINE HCL (CARDIAC) PF 100 MG/5ML IV SOSY
PREFILLED_SYRINGE | INTRAVENOUS | Status: DC | PRN
  Administered 2020-03-06: 100 mg via INTRAVENOUS

## 2020-03-06 MED ORDER — GLYCOPYRROLATE 0.2 MG/ML IJ SOLN
INTRAMUSCULAR | Status: DC | PRN
  Administered 2020-03-06: .2 mg via INTRAVENOUS

## 2020-03-06 MED ORDER — PROPOFOL 10 MG/ML IV BOLUS
INTRAVENOUS | Status: DC | PRN
  Administered 2020-03-06: 40 mg via INTRAVENOUS

## 2020-03-06 MED ORDER — SODIUM CHLORIDE 0.9 % IV SOLN: INTRAVENOUS | Status: DC

## 2020-03-06 MED ORDER — PROPOFOL 500 MG/50ML IV EMUL
INTRAVENOUS | Status: DC | PRN
  Administered 2020-03-06: 155 ug/kg/min via INTRAVENOUS

## 2020-03-06 NOTE — Anesthesia Postprocedure Evaluation (Signed)
Anesthesia Post Note  Patient: DONNIELLE ADDISON  Procedure(s) Performed: COLONOSCOPY WITH PROPOFOL (N/A ) ESOPHAGOGASTRODUODENOSCOPY (EGD) WITH PROPOFOL (N/A )  Patient location during evaluation: Endoscopy Anesthesia Type: General Level of consciousness: awake and alert and oriented Pain management: pain level controlled Vital Signs Assessment: post-procedure vital signs reviewed and stable Respiratory status: spontaneous breathing Cardiovascular status: blood pressure returned to baseline Anesthetic complications: no   No complications documented.   Last Vitals:  Vitals:   03/06/20 1039 03/06/20 1049  BP: (!) 155/90 (!) 150/100  Pulse: (!) 56 (!) 57  Resp: 15 14  Temp:    SpO2: 100% 98%    Last Pain:  Vitals:   03/06/20 1049  TempSrc:   PainSc: 0-No pain                 Jacoba Cherney

## 2020-03-06 NOTE — Op Note (Signed)
[facility] Gastroenterology Patient Name: Nicole Herman Procedure Date: 03/06/2020 9:22 AM MRN: [id number] Account #: [id number] Date of Birth: 1955-10-20 Admit Type: Outpatient Age: 64 Room: [facility] ENDO ROOM 4 Gender: Female Note Status: Finalized Procedure:             Colonoscopy Indications:           Unexplained iron deficiency anemia Providers:             Lin Landsman MD, MD Medicines:             Monitored Anesthesia Care Complications:         No immediate complications. Estimated blood loss: None. Procedure:             Pre-Anesthesia Assessment:                        - Prior to the procedure, a History and Physical was                         performed, and patient medications and allergies were                         reviewed. The patient is competent. The risks and                         benefits of the procedure and the sedation options and                         risks were discussed with the patient. All questions                         were answered and informed consent was obtained.                         Patient identification and proposed procedure were                         verified by the physician, the nurse, the                         anesthesiologist, the anesthetist and the technician                         in the pre-procedure area in the procedure room in the                         endoscopy suite. Mental Status Examination: alert and                         oriented. Airway Examination: normal oropharyngeal                         airway and neck mobility. Respiratory Examination:                         clear to auscultation. CV Examination: normal.                         Prophylactic Antibiotics: The patient does not require  prophylactic antibiotics. Prior Anticoagulants: The                         patient has taken Plavix (clopidogrel), last dose was                         5 days prior to  procedure. ASA Grade Assessment: III -                         A patient with severe systemic disease. After                         reviewing the risks and benefits, the patient was                         deemed in satisfactory condition to undergo the                         procedure. The anesthesia plan was to use monitored                         anesthesia care (MAC). Immediately prior to                         administration of medications, the patient was                         re-assessed for adequacy to receive sedatives. The                         heart rate, respiratory rate, oxygen saturations,                         blood pressure, adequacy of pulmonary ventilation, and                         response to care were monitored throughout the                         procedure. The physical status of the patient was                         re-assessed after the procedure.                        After obtaining informed consent, the colonoscope was                         passed under direct vision. Throughout the procedure,                         the patient's blood pressure, pulse, and oxygen                         saturations were monitored continuously. The                         Colonoscope was introduced through the anus and  advanced to the the terminal ileum, with                         identification of the appendiceal orifice and IC                         valve. The colonoscopy was extremely difficult due to                         restricted mobility of the colon. Successful                         completion of the procedure was aided by changing the                         patient to a prone position, withdrawing the scope and                         replacing with the adult endoscope and applying                         abdominal pressure. The patient tolerated the                         procedure well. The quality of the bowel  preparation                         was evaluated using the BBPS [facility] Bowel Preparation                         Scale) with scores of: Right Colon = 3, Transverse                         Colon = 3 and Left Colon = 3 (entire mucosa seen well                         with no residual staining, small fragments of stool or                         opaque liquid). The total BBPS score equals 9. Findings:      fixed rectosigmoid area from scar tissue, adult endoscope was used      The perianal and digital rectal examinations were normal. Pertinent       negatives include normal sphincter tone and no palpable rectal lesions.      The terminal ileum appeared normal.      Four sessile polyps were found in the recto-sigmoid colon, descending       colon and cecum. The polyps were 3 to 5 mm in size. These polyps were       removed with a cold snare. Resection and retrieval were complete.      Two sessile polyps were found in the ascending colon. The polyps were       diminutive in size. These polyps were removed with a cold biopsy       forceps. Resection and retrieval were complete.      The retroflexed view of the distal rectum and anal verge was normal and  showed no anal or rectal abnormalities. Impression:            - The examined portion of the ileum was normal.                        - Four 3 to 5 mm polyps at the recto-sigmoid colon, in                         the descending colon and in the cecum, removed with a                         cold snare. Resected and retrieved.                        - Two diminutive polyps in the ascending colon,                         removed with a cold biopsy forceps. Resected and                         retrieved.                        - The distal rectum and anal verge are normal on                         retroflexion view. Recommendation:        - Discharge patient to home (with escort).                        - Resume previous diet today.                         - Continue present medications.                        - Await pathology results.                        - Repeat colonoscopy in 5 years for surveillance.                        - Resume Plavix (clopidogrel) at prior dose tomorrow.                        - Return to my office as previously scheduled. Procedure Code(s):     --- Professional ---                        [phone], Colonoscopy, flexible; with removal of                         tumor(s), polyp(s), or other lesion(s) by snare                         technique                        29937, 59, Colonoscopy, flexible; with biopsy, single  or multiple Diagnosis Code(s):     --- Professional ---                        K63.5, Polyp of colon                        D50.9, Iron deficiency anemia, unspecified CPT copyright 2019 American Medical Association. All rights reserved. The codes documented in this report are preliminary and upon coder review may  be revised to meet current compliance requirements. Dr. Ulyess Mort Lin Landsman MD, MD 03/06/2020 10:29:50 AM This report has been signed electronically. Number of Addenda: 0 Note Initiated On: 03/06/2020 9:22 AM Scope Withdrawal Time: 0 hours 18 minutes 5 seconds  Total Procedure Duration: 0 hours 32 minutes 13 seconds  Estimated Blood Loss:  Estimated blood loss: none.      [facility]

## 2020-03-06 NOTE — Anesthesia Preprocedure Evaluation (Signed)
Anesthesia Evaluation  Patient identified by MRN, date of birth, ID band Patient awake    Reviewed: Allergy & Precautions, NPO status , Patient's Chart, lab work & pertinent test results  History of Anesthesia Complications (+) PONV and history of anesthetic complications  Airway Mallampati: III       Dental   Pulmonary shortness of breath and with exertion, COPD, Current Smoker and Patient abstained from smoking.,           Cardiovascular hypertension, + CAD and + Peripheral Vascular Disease  + Valvular Problems/Murmurs      Neuro/Psych PSYCHIATRIC DISORDERS Anxiety    GI/Hepatic Neg liver ROS, GERD  ,  Endo/Other  Hypothyroidism   Renal/GU negative Renal ROS  negative genitourinary   Musculoskeletal   Abdominal   Peds negative pediatric ROS (+)  Hematology  (+) anemia ,   Anesthesia Other Findings Past Medical History: 2017: Airway polyps No date: Anemia No date: Anxiety 07/2019: Carotid artery stenosis, unilateral, right No date: Colon polyps No date: Complication of anesthesia No date: Coronary artery disease     Comment:  s/p carotid surgery left  No date: Does use hearing aid No date: Dyspnea     Comment:  d/t vocal cord polyps No date: Dysrhythmia     Comment:  bradycardia when in hospital getting stent placed No date: Fibromyalgia No date: GERD (gastroesophageal reflux disease) No date: Granuloma annulare No date: Heart murmur     Comment:  born with it, no treatment No date: History of chicken pox No date: Hypertension No date: PONV (postoperative nausea and vomiting) No date: Thyroid disease  Reproductive/Obstetrics                             Anesthesia Physical Anesthesia Plan  ASA: III  Anesthesia Plan: General   Post-op Pain Management:    Induction: Intravenous  PONV Risk Score and Plan: Propofol infusion  Airway Management Planned: Nasal  Cannula  Additional Equipment:   Intra-op Plan:   Post-operative Plan:   Informed Consent: I have reviewed the patients History and Physical, chart, labs and discussed the procedure including the risks, benefits and alternatives for the proposed anesthesia with the patient or authorized representative who has indicated his/her understanding and acceptance.     Dental advisory given  Plan Discussed with: CRNA and Surgeon  Anesthesia Plan Comments:         Anesthesia Quick Evaluation

## 2020-03-06 NOTE — Transfer of Care (Signed)
Immediate Anesthesia Transfer of Care Note  Patient: Nicole Herman  Procedure(s) Performed: COLONOSCOPY WITH PROPOFOL (N/A ) ESOPHAGOGASTRODUODENOSCOPY (EGD) WITH PROPOFOL (N/A )  Patient Location: Endoscopy Unit  Anesthesia Type:General  Level of Consciousness: drowsy and responds to stimulation  Airway & Oxygen Therapy: Patient Spontanous Breathing and Patient connected to face mask oxygen  Post-op Assessment: Report given to RN and Post -op Vital signs reviewed and stable  Post vital signs: Reviewed and stable  Last Vitals:  Vitals Value Taken Time  BP 133/69 03/06/20 1029  Temp 36.1 C 03/06/20 1029  Pulse 56 03/06/20 1031  Resp 10 03/06/20 1031  SpO2 100 % 03/06/20 1031  Vitals shown include unvalidated device data.  Last Pain:  Vitals:   03/06/20 1029  TempSrc: Temporal  PainSc: 0-No pain         Complications: No complications documented.

## 2020-03-06 NOTE — Anesthesia Procedure Notes (Addendum)
Procedure Name: General with mask airway Performed by: Fletcher-Harrison, Nayomi Tabron, CRNA Pre-anesthesia Checklist: Patient identified, Emergency Drugs available, Suction available and Patient being monitored Patient Re-evaluated:Patient Re-evaluated prior to induction Oxygen Delivery Method: Simple face mask Induction Type: IV induction Placement Confirmation: positive ETCO2 and CO2 detector Dental Injury: Teeth and Oropharynx as per pre-operative assessment        

## 2020-03-06 NOTE — H&P (Signed)
Nicole Darby, MD [address]  [place]  [place], [place] [address]  Main: [phone]  Fax: [phone] Pager: [phone]  Primary Care Physician:  Idelle Crouch, MD Primary Gastroenterologist:  Dr. Cephas Herman  Pre-Procedure History & Physical: HPI:  Nicole Herman is a 64 y.o. female is here for an endoscopy and colonoscopy.   Past Medical History:  Diagnosis Date  . Airway polyps 2017  . Anemia   . Anxiety   . Carotid artery stenosis, unilateral, right 07/2019  . Colon polyps   . Complication of anesthesia   . Coronary artery disease    s/p carotid surgery left   . Does use hearing aid   . Dyspnea    d/t vocal cord polyps  . Dysrhythmia    bradycardia when in hospital getting stent placed  . Fibromyalgia   . GERD (gastroesophageal reflux disease)   . Granuloma annulare   . Heart murmur    born with it, no treatment  . History of chicken pox   . Hypertension   . PONV (postoperative nausea and vomiting)   . Thyroid disease     Past Surgical History:  Procedure Laterality Date  . ABDOMINAL HYSTERECTOMY     1985  . CAROTID ENDARTERECTOMY     left   . CAROTID PTA/STENT INTERVENTION Right 03/22/2019   Procedure: CAROTID PTA/STENT INTERVENTION;  Surgeon: Katha Cabal, MD;  Location: [facility];  Service: Cardiovascular;  Laterality: Right;  . CARPAL TUNNEL RELEASE     x2  . [place]  . OOPHORECTOMY     1/2 ovaries removed   . STAPEDES SURGERY     otosclerosis right .  prostetic in right.  Marland Kitchen TRIGGER FINGER RELEASE      Prior to Admission medications   Medication Sig Start Date End Date Taking? Authorizing Provider  aspirin 81 MG chewable tablet Chew 81 mg by mouth daily.    Yes [provider]  atorvastatin (LIPITOR) 40 MG tablet Take 10 mg by mouth daily at 6 PM.    Yes [provider]  clopidogrel (PLAVIX) 75 MG tablet Take 1 tablet (75 mg total) by mouth daily. 03/25/19  Yes Stegmayer,  Janalyn Harder, PA-C  levothyroxine (SYNTHROID) 75 MCG tablet TAKE 1 TABLET(75 MCG) BY MOUTH DAILY BEFORE BREAKFAST. SKIP SUNDAYS Patient taking differently: Take 75 mcg by mouth See admin instructions. TAKE 75 MCG BY MOUTH DAILY BEFORE BREAKFAST SKIPPING SUNDAYS 04/06/19  Yes McLean-Scocuzza, Nino Glow, MD  pantoprazole (PROTONIX) 40 MG tablet Take 1 tablet (40 mg total) by mouth 2 (two) times daily before a meal. 30 minutes before meal 04/27/19  Yes McLean-Scocuzza, Nino Glow, MD  telmisartan (MICARDIS) 40 MG tablet Take 0.5 tablets (20 mg total) by mouth daily. Take 1/2 tab po daily Patient taking differently: Take 20 mg by mouth daily.  03/21/19  Yes McLean-Scocuzza, Nino Glow, MD  nicotine (NICODERM CQ - DOSED IN MG/24 HOURS) 21 mg/24hr patch Place 21 mg onto the skin daily. 06/13/19   [provider]    Allergies as of 02/13/2020 - Review Complete 02/13/2020  Allergen Reaction Noted  . Chantix [varenicline tartrate]  03/15/2019  . Atorvastatin Other (See Comments) 09/26/2019  . Penicillins Rash 09/16/2017  . Wellbutrin [bupropion]  03/15/2019    Family History  Problem Relation Age of Onset  . Hypertension Mother   . Dementia Mother   . Arthritis Mother   . Hyperlipidemia Mother   .  Cancer Father        colon   . Early death Father   . Learning disabilities Brother   . Colon polyps Brother   . Hyperlipidemia Brother   . Hypertension Brother   . Heart attack Maternal Grandmother   . Arthritis Maternal Grandmother   . Heart disease Maternal Grandmother   . Colon polyps Sister   . Lupus Sister   . Heart disease Maternal Grandfather   . Heart disease Paternal Grandmother   . Cancer Other     Social History   Socioeconomic History  . Marital status: Single    Spouse name: Not on file  . Number of children: Not on file  . Years of education: Not on file  . Highest education level: Not on file  Occupational History  . Occupation: was a Quarry manager    Comment: retired  Tobacco Use    . Smoking status: Light Tobacco Smoker    Packs/day: 0.25    Types: Cigarettes  . Smokeless tobacco: Never Used  Vaping Use  . Vaping Use: Former  Substance and Sexual Activity  . Alcohol use: No  . Drug use: No  . Sexual activity: Yes  Other Topics Concern  . Not on file  Social History Narrative   1 son and 1 grandchild in [place] in [place]    1 fiance of 14 years as of 03/15/2019 (with h/o alcoholism, cardiac defibrillator, liver failure)    Was CNA   Social Determinants of Health   Financial Resource Strain:   . Difficulty of Paying Living Expenses:   Food Insecurity:   . Worried About Charity fundraiser in the Last Year:   . Arboriculturist in the Last Year:   Transportation Needs:   . Film/video editor (Medical):   Marland Kitchen Lack of Transportation (Non-Medical):   Physical Activity:   . Days of Exercise per Week:   . Minutes of Exercise per Session:   Stress:   . Feeling of Stress :   Social Connections:   . Frequency of Communication with Friends and Family:   . Frequency of Social Gatherings with Friends and Family:   . Attends Religious Services:   . Active Member of Clubs or Organizations:   . Attends Archivist Meetings:   Marland Kitchen Marital Status:   Intimate Partner Violence:   . Fear of Current or Ex-Partner:   . Emotionally Abused:   Marland Kitchen Physically Abused:   . Sexually Abused:     Review of Systems: See HPI, otherwise negative ROS  Physical Exam: BP 130/73   Pulse (!) 55   Temp 98.3 F (36.8 C) (Temporal)   Resp 17   Ht 5\' 4"  (1.626 m)   Wt 61.2 kg   SpO2 100%   BMI 23.17 kg/m  General:   Alert,  pleasant and cooperative in NAD Head:  Normocephalic and atraumatic. Neck:  Supple; no masses or thyromegaly. Lungs:  Clear throughout to auscultation.    Heart:  Regular rate and rhythm. Abdomen:  Soft, nontender and nondistended. Normal bowel sounds, without guarding, and without rebound.   Neurologic:  Alert and  oriented x4;  grossly normal  neurologically.  Impression/Plan: Nicole Herman is here for an endoscopy and colonoscopy to be performed for chronic GERD and iron deficiency anemia  Risks, benefits, limitations, and alternatives regarding  endoscopy and colonoscopy have been reviewed with the patient.  Questions have been answered.  All parties agreeable.  Sherri Sear, MD  03/06/2020, 8:40 AM

## 2020-03-06 NOTE — Op Note (Signed)
[facility] Patient Name: Nicole Herman Procedure Date: 03/06/2020 9:22 AM MRN: [id number] Account #: [id number] Date of Birth: [birth year] Admit Type: Outpatient Age: 65 Room: [facility] ENDO ROOM 4 Gender: Female Note Status: Finalized Procedure:             Upper GI endoscopy Indications:           Unexplained iron deficiency anemia Providers:             Lin Landsman MD, MD Medicines:             Monitored Anesthesia Care Complications:         No immediate complications. Estimated blood loss: None. Procedure:             Pre-Anesthesia Assessment:                        - Prior to the procedure, a History and Physical was                         performed, and patient medications and allergies were                         reviewed. The patient is competent. The risks and                         benefits of the procedure and the sedation options and                         risks were discussed with the patient. All questions                         were answered and informed consent was obtained.                         Patient identification and proposed procedure were                         verified by the physician, the nurse, the                         anesthesiologist, the anesthetist and the technician                         in the pre-procedure area in the procedure room in the                         endoscopy suite. Mental Status Examination: alert and                         oriented. Airway Examination: normal oropharyngeal                         airway and neck mobility. Respiratory Examination:                         clear to auscultation. CV Examination: normal.                         Prophylactic Antibiotics: The patient does  not require                         prophylactic antibiotics. Prior Anticoagulants: The                         patient has taken Plavix (clopidogrel), last dose was                         5 days prior  to procedure. ASA Grade Assessment: III -                         A patient with severe systemic disease. After                         reviewing the risks and benefits, the patient was                         deemed in satisfactory condition to undergo the                         procedure. The anesthesia plan was to use monitored                         anesthesia care (MAC). Immediately prior to                         administration of medications, the patient was                         re-assessed for adequacy to receive sedatives. The                         heart rate, respiratory rate, oxygen saturations,                         blood pressure, adequacy of pulmonary ventilation, and                         response to care were monitored throughout the                         procedure. The physical status of the patient was                         re-assessed after the procedure.                        After obtaining informed consent, the endoscope was                         passed under direct vision. Throughout the procedure,                         the patient's blood pressure, pulse, and oxygen                         saturations were monitored continuously. The Endoscope  was introduced through the mouth, and advanced to the                         second part of duodenum. The upper GI endoscopy was                         accomplished without difficulty. The patient tolerated                         the procedure well. Findings:      The examined duodenum was normal. Biopsies for histology were taken with       a cold forceps for evaluation of celiac disease.      The entire examined stomach was normal. Biopsies were taken with a cold       forceps for Helicobacter pylori testing.      The cardia and gastric fundus were normal on retroflexion.      Esophagogastric landmarks were identified: the gastroesophageal junction       was found at 35 cm  from the incisors.      One tongue of salmon-colored mucosa was present at 35 cm. No other       visible abnormalities were present. Biopsies were taken with a cold       forceps for histology. Impression:            - Normal examined duodenum. Biopsied.                        - Normal stomach. Biopsied.                        - Esophagogastric landmarks identified.                        - Salmon-colored mucosa suspicious for short-segment                         Barrett's esophagus. Biopsied. Recommendation:        - Await pathology results.                        - Proceed with colonoscopy as scheduled                        See colonoscopy report Procedure Code(s):     --- Professional ---                        [phone], Esophagogastroduodenoscopy, flexible,                         transoral; with biopsy, single or multiple Diagnosis Code(s):     --- Professional ---                        K22.8, Other specified diseases of esophagus                        D50.9, Iron deficiency anemia, unspecified CPT copyright 2019 American Medical Association. All rights reserved. The codes documented in this report are preliminary and upon coder review may  be revised to meet current compliance requirements. Dr. Ulyess Mort Aiyah Scarpelli  Raeanne Gathers MD, MD 03/06/2020 9:51:37 AM This report has been signed electronically. Number of Addenda: 0 Note Initiated On: 03/06/2020 9:22 AM Estimated Blood Loss:  Estimated blood loss: none.      [facility]

## 2020-03-07 ENCOUNTER — Encounter: Payer: Self-pay | Admitting: Gastroenterology

## 2020-03-07 LAB — SURGICAL PATHOLOGY

## 2020-03-08 ENCOUNTER — Telehealth: Payer: Self-pay

## 2020-03-08 ENCOUNTER — Encounter: Payer: Self-pay | Admitting: Gastroenterology

## 2020-03-08 DIAGNOSIS — D500 Iron deficiency anemia secondary to blood loss (chronic): Secondary | ICD-10-CM

## 2020-03-08 NOTE — Telephone Encounter (Signed)
Called and left a message for call back order the labs

## 2020-03-08 NOTE — Telephone Encounter (Signed)
-----   Message from Lin Landsman, MD sent at 03/08/2020 11:46 AM EDT ----- Pathology results from recent upper endoscopy came back normal.  Recommend to recheck CBC and iron panel.  As well as, need a capsule endoscopy for unexplained iron deficiency anemia  The polyp from colon came back benign.  Recommend surveillance colonoscopy in 7 years  Rohini Vanga

## 2020-03-11 NOTE — Telephone Encounter (Signed)
Called and left a message for call back. Sent letter  

## 2020-04-22 ENCOUNTER — Other Ambulatory Visit: Payer: Self-pay

## 2020-04-22 ENCOUNTER — Ambulatory Visit (INDEPENDENT_AMBULATORY_CARE_PROVIDER_SITE_OTHER): Payer: No Typology Code available for payment source

## 2020-04-22 ENCOUNTER — Ambulatory Visit (INDEPENDENT_AMBULATORY_CARE_PROVIDER_SITE_OTHER): Payer: No Typology Code available for payment source | Admitting: Nurse Practitioner

## 2020-04-22 ENCOUNTER — Encounter (INDEPENDENT_AMBULATORY_CARE_PROVIDER_SITE_OTHER): Payer: Self-pay | Admitting: Nurse Practitioner

## 2020-04-22 VITALS — BP 145/92 | HR 61 | Resp 16 | Wt 138.0 lb

## 2020-04-22 DIAGNOSIS — E782 Mixed hyperlipidemia: Secondary | ICD-10-CM

## 2020-04-22 DIAGNOSIS — Z720 Tobacco use: Secondary | ICD-10-CM

## 2020-04-22 DIAGNOSIS — I10 Essential (primary) hypertension: Secondary | ICD-10-CM | POA: Diagnosis not present

## 2020-04-22 DIAGNOSIS — I6523 Occlusion and stenosis of bilateral carotid arteries: Secondary | ICD-10-CM

## 2020-04-23 ENCOUNTER — Encounter (INDEPENDENT_AMBULATORY_CARE_PROVIDER_SITE_OTHER): Payer: Self-pay | Admitting: Nurse Practitioner

## 2020-04-23 NOTE — Progress Notes (Signed)
Subjective:    Patient ID: Nicole Herman, female    DOB: 25-Mar-1956, 64 y.o.   MRN: [id number] Chief Complaint  Patient presents with  . Follow-up    ultrasound follow up    The patient is seen for follow up evaluation of carotid stenosis. The carotid stenosis followed by ultrasound.   The patient denies amaurosis fugax. There is no recent history of TIA symptoms or focal motor deficits. There is no prior documented CVA.  The patient is taking enteric-coated aspirin 81 mg daily.  There is no history of migraine headaches. There is no history of seizures.  The patient has a history of coronary artery disease, no recent episodes of angina or shortness of breath. The patient denies PAD or claudication symptoms. There is a history of hyperlipidemia which is being treated with a statin.    Carotid Duplex done today shows velocities consistent with a 1 to 39% stenosis in the left ICA with a 40 to 59% stenosis within the right ICA.  Previously placed stent in the right carotid artery is patent.  The patient previously had a right carotid stent placed in 2020 and a left carotid endarterectomy in 2012.   Review of Systems  Eyes: Negative for visual disturbance.  Respiratory: Negative for shortness of breath.   Musculoskeletal: Negative for neck stiffness.  All other systems reviewed and are negative.      Objective:   Physical Exam Vitals reviewed.  HENT:     Head: Normocephalic.  Neck:     Vascular: Carotid bruit present.  Cardiovascular:     Rate and Rhythm: Normal rate and regular rhythm.     Pulses: Normal pulses.  Pulmonary:     Effort: Pulmonary effort is normal.  Musculoskeletal:        General: Normal range of motion.  Neurological:     Mental Status: She is alert and oriented to person, place, and time.  Psychiatric:        Mood and Affect: Mood normal.        Behavior: Behavior normal.        Thought Content: Thought content normal.        Judgment: Judgment  normal.     BP (!) 145/92 (BP Location: Left Arm)   Pulse 61   Resp 16   Wt 138 lb (62.6 kg)   BMI 23.69 kg/m   Past Medical History:  Diagnosis Date  . Airway polyps 2017  . Anemia   . Anxiety   . Carotid artery stenosis, unilateral, right 07/2019  . Colon polyps   . Complication of anesthesia   . Coronary artery disease    s/p carotid surgery left   . Does use hearing aid   . Dyspnea    d/t vocal cord polyps  . Dysrhythmia    bradycardia when in hospital getting stent placed  . Fibromyalgia   . GERD (gastroesophageal reflux disease)   . Granuloma annulare   . Heart murmur    born with it, no treatment  . History of chicken pox   . Hypertension   . PONV (postoperative nausea and vomiting)   . Thyroid disease     Social History   Socioeconomic History  . Marital status: Single    Spouse name: Not on file  . Number of children: Not on file  . Years of education: Not on file  . Highest education level: Not on file  Occupational History  . Occupation: was a Quarry manager  Comment: retired  Tobacco Use  . Smoking status: Light Tobacco Smoker    Packs/day: 0.25    Types: Cigarettes  . Smokeless tobacco: Never Used  Vaping Use  . Vaping Use: Former  Substance and Sexual Activity  . Alcohol use: No  . Drug use: No  . Sexual activity: Yes  Other Topics Concern  . Not on file  Social History Narrative   1 son and 1 grandchild in [place] in [place]    1 fiance of 14 years as of 03/15/2019 (with h/o alcoholism, cardiac defibrillator, liver failure)    Was CNA   Social Determinants of Health   Financial Resource Strain:   . Difficulty of Paying Living Expenses:   Food Insecurity:   . Worried About Charity fundraiser in the Last Year:   . Arboriculturist in the Last Year:   Transportation Needs:   . Film/video editor (Medical):   Marland Kitchen Lack of Transportation (Non-Medical):   Physical Activity:   . Days of Exercise per Week:   . Minutes of Exercise per Session:    Stress:   . Feeling of Stress :   Social Connections:   . Frequency of Communication with Friends and Family:   . Frequency of Social Gatherings with Friends and Family:   . Attends Religious Services:   . Active Member of Clubs or Organizations:   . Attends Archivist Meetings:   Marland Kitchen Marital Status:   Intimate Partner Violence:   . Fear of Current or Ex-Partner:   . Emotionally Abused:   Marland Kitchen Physically Abused:   . Sexually Abused:     Past Surgical History:  Procedure Laterality Date  . ABDOMINAL HYSTERECTOMY     1985  . CAROTID ENDARTERECTOMY     left   . CAROTID PTA/STENT INTERVENTION Right 03/22/2019   Procedure: CAROTID PTA/STENT INTERVENTION;  Surgeon: Katha Cabal, MD;  Location: [facility];  Service: Cardiovascular;  Laterality: Right;  . CARPAL TUNNEL RELEASE     x2  . [place]  . COLONOSCOPY WITH PROPOFOL N/A 03/06/2020   Procedure: COLONOSCOPY WITH PROPOFOL;  Surgeon: Lin Landsman, MD;  Location: [facility] ENDOSCOPY;  Service: Gastroenterology;  Laterality: N/A;  . ESOPHAGOGASTRODUODENOSCOPY (EGD) WITH PROPOFOL N/A 03/06/2020   Procedure: ESOPHAGOGASTRODUODENOSCOPY (EGD) WITH PROPOFOL;  Surgeon: Lin Landsman, MD;  Location: [facility] ENDOSCOPY;  Service: Gastroenterology;  Laterality: N/A;  . OOPHORECTOMY     1/2 ovaries removed   . STAPEDES SURGERY     otosclerosis right .  prostetic in right.  Marland Kitchen TRIGGER FINGER RELEASE      Family History  Problem Relation Age of Onset  . Hypertension Mother   . Dementia Mother   . Arthritis Mother   . Hyperlipidemia Mother   . Cancer Father        colon   . Early death Father   . Learning disabilities Brother   . Colon polyps Brother   . Hyperlipidemia Brother   . Hypertension Brother   . Heart attack Maternal Grandmother   . Arthritis Maternal Grandmother   . Heart disease Maternal Grandmother   . Colon polyps Sister   . Lupus Sister   . Heart disease Maternal Grandfather    . Heart disease Paternal Grandmother   . Cancer Other     Allergies  Allergen Reactions  . Chantix [Varenicline Tartrate]     Nightmares    . Atorvastatin Other (  See Comments)    Body aches--on higher doses  . Penicillins Rash    Did it involve swelling of the face/tongue/throat, SOB, or low BP? No Did it involve sudden or severe rash/hives, skin peeling, or any reaction on the inside of your mouth or nose? Yes Did you need to seek medical attention at a hospital or doctor's office? Yes When did it last happen?3-4 years ago If all above answers are "NO", may proceed with cephalosporin use.   . Wellbutrin [Bupropion]     Did not like the way it made her feel: "too wired."        Assessment & Plan:   1. Carotid stenosis, bilateral Recommend:  Given the patient's asymptomatic subcritical stenosis no further invasive testing or surgery at this time.  Carotid Duplex done today shows velocities consistent with a 1 to 39% stenosis in the left ICA with a 40 to 59% stenosis within the right ICA.  Previously placed stent in the right carotid artery is patent.  Continue antiplatelet therapy as prescribed Continue management of CAD, HTN and Hyperlipidemia Healthy heart diet,  encouraged exercise at least 4 times per week Follow up in 12 months with duplex ultrasound and physical exam   2. Mixed hyperlipidemia Continue statin as ordered and reviewed, no changes at this time   3. Essential hypertension Continue antihypertensive medications as already ordered, these medications have been reviewed and there are no changes at this time.   4. Tobacco abuse Smoking cessation was discussed, 3-10 minutes spent on this topic specifically    Current Outpatient Medications on File Prior to Visit  Medication Sig Dispense Refill  . aspirin 81 MG chewable tablet Chew 81 mg by mouth daily.     Marland Kitchen atorvastatin (LIPITOR) 40 MG tablet Take 10 mg by mouth daily at 6 PM.     . clopidogrel  (PLAVIX) 75 MG tablet Take 1 tablet (75 mg total) by mouth daily. 90 tablet 3  . IRON, FERROUS SULFATE, PO Take 75 mg by mouth in the morning and at bedtime.    Marland Kitchen levothyroxine (SYNTHROID) 75 MCG tablet TAKE 1 TABLET(75 MCG) BY MOUTH DAILY BEFORE BREAKFAST. SKIP SUNDAYS (Patient taking differently: Take 75 mcg by mouth See admin instructions. TAKE 75 MCG BY MOUTH DAILY BEFORE BREAKFAST SKIPPING SUNDAYS) 90 tablet 3  . pantoprazole (PROTONIX) 40 MG tablet Take 1 tablet (40 mg total) by mouth 2 (two) times daily before a meal. 30 minutes before meal 180 tablet 1  . telmisartan (MICARDIS) 40 MG tablet Take 0.5 tablets (20 mg total) by mouth daily. Take 1/2 tab po daily (Patient taking differently: Take 20 mg by mouth daily. ) 45 tablet 3  . nicotine (NICODERM CQ - DOSED IN MG/24 HOURS) 21 mg/24hr patch Place 21 mg onto the skin daily. (Patient not taking: Reported on 04/22/2020)     No current facility-administered medications on file prior to visit.    There are no Patient Instructions on file for this visit. No follow-ups on file.   Kris Hartmann, NP

## 2020-05-31 ENCOUNTER — Telehealth: Payer: Self-pay | Admitting: Gastroenterology

## 2020-05-31 NOTE — Telephone Encounter (Signed)
Patient called to cancel appt scheduled on 06/04/20 and stated that she would call back to reschedule.

## 2020-06-04 ENCOUNTER — Ambulatory Visit: Payer: No Typology Code available for payment source | Admitting: Gastroenterology

## 2020-07-11 IMAGING — CT CT ANGIO NECK
3 of 7 series · 8 of 33 positions shown · IV contrast (iopamidol)
Comparison: CTA neck 12/17/2010

CLINICAL DATA: Right carotid stenosis. Abnormal ultrasound.
Previous left carotid endarterectomy. Possible recurrent left
internal carotid artery stenosis.

EXAM:
CT ANGIOGRAPHY NECK
TECHNIQUE: Multidetector CT imaging of the neck was performed using the
standard protocol during bolus administration of intravenous
contrast. Multiplanar CT image reconstructions and MIPs were
obtained to evaluate the vascular anatomy. Carotid stenosis
measurements (when applicable) are obtained utilizing NASCET
criteria, using the distal internal carotid diameter as the
denominator.
CONTRAST:  75mL [id number] IOPAMIDOL (LXCSJ3-LR5) INJECTION 76%

[Series 6: cta neck cta neck (person_name) · axial · 0.42mm/px · z∈[-711,-621]mm · 2 of 135 slices shown]
[im 45/135  soft-tissue]
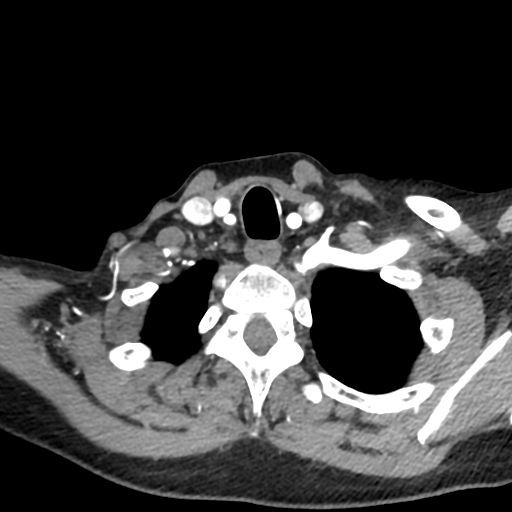
[im 90/135  soft-tissue]
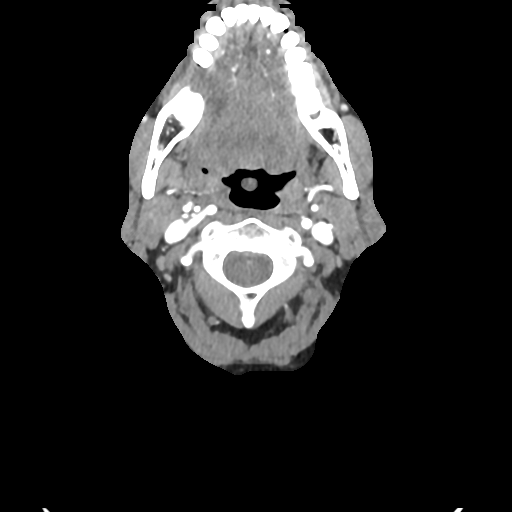

[Series 7: ax thin mips cta neck · axial · 0.42mm/px · z∈[-756,-576]mm · 5 of 270 slices shown]
[im 45/270  soft-tissue]
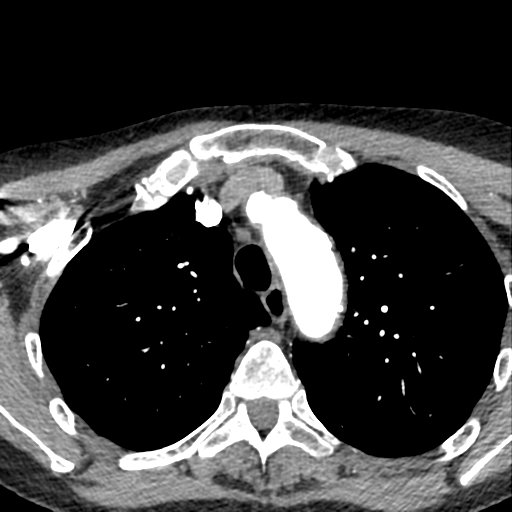
[im 90/270  bone]
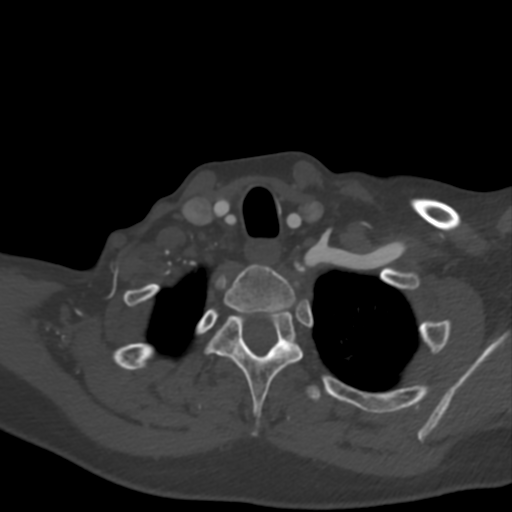
[im 135/270  soft-tissue]
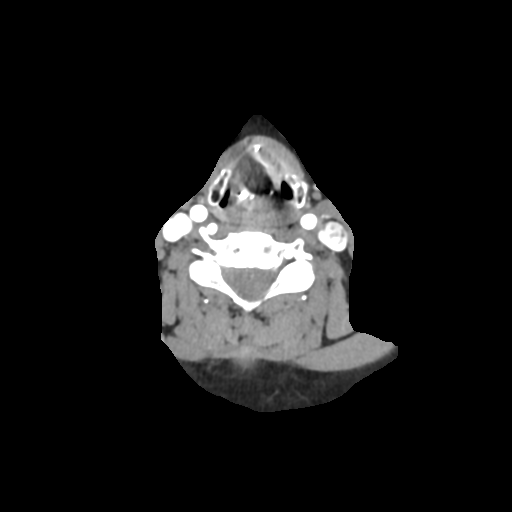
[im 180/270  bone]
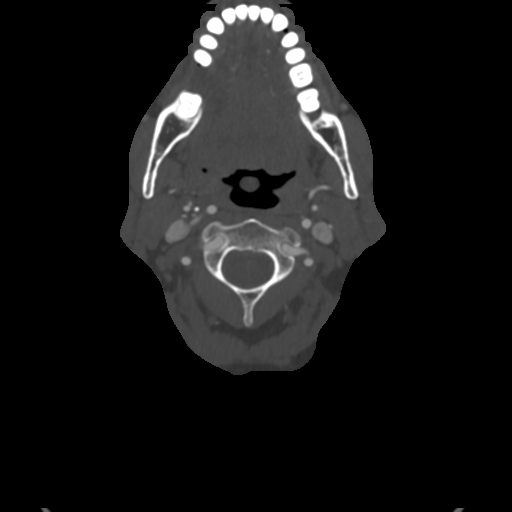
[im 225/270  soft-tissue]
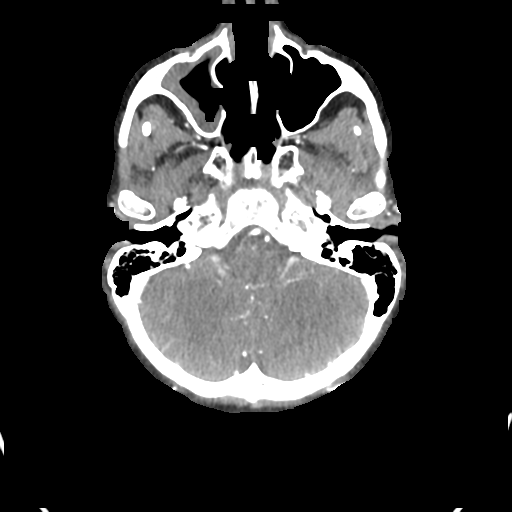

[Series 11: sag thin mips cta neck · sagittal · 0.42mm/px · 1 of 217 slices shown]
[im 109/217  soft-tissue]
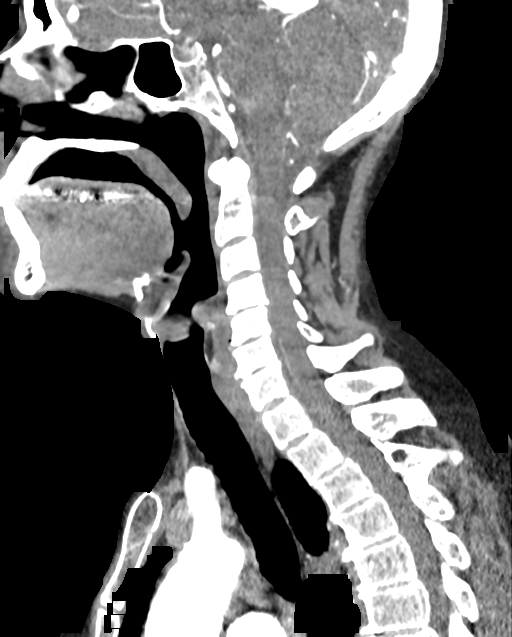

[8 of 33 positions shown; findings below may reference images not displayed]

FINDINGS: Aortic arch: Progressive atherosclerotic calcifications are present
at the aortic arch. There is no aneurysm. No significant stenosis is
present at the great vessel origins. Moderate atherosclerotic
irregularity is present in the proximal left subclavian artery
without a significant stenosis relative to the more distal vessel.

Right carotid system: The right common carotid artery is within
normal limits. The lumen is narrowed to 1.3 mm. A lateral punctate
ulceration is present within soft tissue plaque. There is mild
tortuosity of the cervical right ICA without a tandem stenosis. The
more distal vessel measures 3.4 mm. No other significant stenosis is
present through the ICA termini.

Left carotid system: The left common carotid artery is within normal
limits. Endarterectomy is noted. No significant residual recurrent
stenosis is present relative to the more distal vessel. There is
moderate tortuosity of the cervical left ICA just below the skull
base. Atherosclerotic calcifications are present within the
cavernous left ICA without a significant stenosis relative to the
ICA terminus. Visualized intracranial vessels are within normal
limits.

Vertebral arteries: The vertebral arteries originate from the
subclavian arteries without significant stenosis. The right
vertebral artery is the dominant vessel. The right vertebral artery
enters at C4, a normal variant. There is no significant stenosis of
either vertebral artery in the neck. The vertebrobasilar junction is
normal. PICA origins are below the dural margin. Basilar artery is
normal. Both posterior cerebral arteries originate from the basilar
tip.

Skeleton: Uncovertebral disease is most prominent at C4-5, C5-6, and
C6-7. Osseous foraminal narrowing is greatest on the right at C5-6
and on the left at C4-5. Vertebral body heights are maintained. No
focal lytic or blastic lesions are present otherwise.

Other neck: The soft tissues of the neck are otherwise unremarkable.
Thyroid is atrophic. Question left thyroidectomy. No significant
adenopathy is present. Salivary glands are within normal limits. No
focal mucosal or submucosal lesions are present.

Circumferential mucosal disease is present in the right maxillary
sinus without fluid level. Mastoid air cells are clear.

Upper chest: Centrilobular emphysematous changes are present
bilaterally. No focal nodule, mass, or airspace disease is present.
The thoracic inlet is within normal limits. Subcentimeter
paratracheal nodes are likely inflammatory.
IMPRESSION: 1. 60-65% stenosis of the right internal carotid artery with
narrowing of the lumen to less than 1.5 mm.
2. Left carotid endarterectomy with some atherosclerotic
irregularity but no significant recurrent stenosis relative to the
more distal vessels.
3. Atherosclerotic changes within the proximal left subclavian
artery without a significant stenosis.
4. Degenerative changes of the cervical spine as described.
5. Atherosclerotic changes within the cavernous internal carotid
arteries without a significant stenosis.

## 2020-07-29 DIAGNOSIS — M19032 Primary osteoarthritis, left wrist: Secondary | ICD-10-CM | POA: Insufficient documentation

## 2020-07-29 DIAGNOSIS — M7581 Other shoulder lesions, right shoulder: Secondary | ICD-10-CM | POA: Insufficient documentation

## 2020-11-29 ENCOUNTER — Other Ambulatory Visit: Payer: Self-pay | Admitting: Surgery

## 2020-11-29 DIAGNOSIS — M7581 Other shoulder lesions, right shoulder: Secondary | ICD-10-CM

## 2020-12-13 ENCOUNTER — Ambulatory Visit: Payer: BLUE CROSS/BLUE SHIELD

## 2021-04-16 ENCOUNTER — Other Ambulatory Visit (INDEPENDENT_AMBULATORY_CARE_PROVIDER_SITE_OTHER): Payer: Self-pay | Admitting: Vascular Surgery

## 2021-04-16 DIAGNOSIS — I6523 Occlusion and stenosis of bilateral carotid arteries: Secondary | ICD-10-CM

## 2021-04-20 NOTE — Progress Notes (Signed)
MRN : [id number]  Nicole Herman is a 65 y.o. (09/13/1956) female who presents with chief complaint of follow up carotid.  History of Present Illness:   The patient is status post a left CEA on Jan 25, 2011 and placement of a 9 mm x 7 mm x 30 mm exact stent with the use of the NAV-6 embolic protection device in the right carotid artery.  Remote history of a left CEA.    There were no post operative problems or complications related to the surgery.  The patient denies neck or incisional pain.   The patient denies interval amaurosis fugax. There is no recent history of TIA symptoms or focal motor deficits. There is no prior documented CVA.   The patient denies headache.   The patient is taking enteric-coated aspirin 81 mg daily.   The patient has a history of coronary artery disease, no recent episodes of angina or shortness of breath. The patient denies PAD or claudication symptoms. There is a history of hyperlipidemia which is being treated with a statin.    Carotid duplex today shows bilateral <40% ICA stenosis.  No change bilaterally compared to the last study  No outpatient medications have been marked as taking for the 04/21/21 encounter (Appointment) with Delana Meyer, Dolores Lory, MD.    Past Medical History:  Diagnosis Date   Airway polyps 2017   Anemia    Anxiety    Carotid artery stenosis, unilateral, right 07/2019   Colon polyps    Complication of anesthesia    Coronary artery disease    s/p carotid surgery left    Does use hearing aid    Dyspnea    d/t vocal cord polyps   Dysrhythmia    bradycardia when in hospital getting stent placed   Fibromyalgia    GERD (gastroesophageal reflux disease)    Granuloma annulare    Heart murmur    born with it, no treatment   History of chicken pox    Hypertension    PONV (postoperative nausea and vomiting)    Thyroid disease     Past Surgical History:  Procedure Laterality Date   ABDOMINAL HYSTERECTOMY     1985   CAROTID  ENDARTERECTOMY     left    CAROTID PTA/STENT INTERVENTION Right 03/22/2019   Procedure: CAROTID PTA/STENT INTERVENTION;  Surgeon: Katha Cabal, MD;  Location: [facility];  Service: Cardiovascular;  Laterality: Right;   CARPAL TUNNEL RELEASE     x2   CESAREAN SECTION     1982   COLONOSCOPY WITH PROPOFOL N/A 03/06/2020   Procedure: COLONOSCOPY WITH PROPOFOL;  Surgeon: Lin Landsman, MD;  Location: [facility] ENDOSCOPY;  Service: Gastroenterology;  Laterality: N/A;   ESOPHAGOGASTRODUODENOSCOPY (EGD) WITH PROPOFOL N/A 03/06/2020   Procedure: ESOPHAGOGASTRODUODENOSCOPY (EGD) WITH PROPOFOL;  Surgeon: Lin Landsman, MD;  Location: [facility];  Service: Gastroenterology;  Laterality: N/A;   OOPHORECTOMY     1/2 ovaries removed    STAPEDES SURGERY     otosclerosis right .  prostetic in right.   TRIGGER FINGER RELEASE      Social History Social History   Tobacco Use   Smoking status: Light Smoker    Packs/day: 0.25    Types: Cigarettes   Smokeless tobacco: Never  Vaping Use   Vaping Use: Former  Substance Use Topics   Alcohol use: No   Drug use: No    Family History Family History  Problem Relation Age of Onset  Hypertension Mother    Dementia Mother    Arthritis Mother    Hyperlipidemia Mother    Cancer Father        colon    Early death Father    Learning disabilities Brother    Colon polyps Brother    Hyperlipidemia Brother    Hypertension Brother    Heart attack Maternal Grandmother    Arthritis Maternal Grandmother    Heart disease Maternal Grandmother    Colon polyps Sister    Lupus Sister    Heart disease Maternal Grandfather    Heart disease Paternal Grandmother    Cancer Other     Allergies  Allergen Reactions   Chantix [Varenicline Tartrate]     Nightmares     Atorvastatin Other (See Comments)    Body aches--on higher doses   Penicillins Rash    Did it involve swelling of the face/tongue/throat, SOB, or low BP? No Did it  involve sudden or severe rash/hives, skin peeling, or any reaction on the inside of your mouth or nose? Yes Did you need to seek medical attention at a hospital or doctor's office? Yes When did it last happen? 3-4 years ago If all above answers are "NO", may proceed with cephalosporin use.    Wellbutrin [Bupropion]     Did not like the way it made her feel: "too wired."      REVIEW OF SYSTEMS (Negative unless checked)  Constitutional: '[]'$ Weight loss  '[]'$ Fever  '[]'$ Chills Cardiac: '[]'$ Chest pain   '[]'$ Chest pressure   '[]'$ Palpitations   '[]'$ Shortness of breath when laying flat   '[]'$ Shortness of breath with exertion. Vascular:  '[]'$ Pain in legs with walking   '[]'$ Pain in legs at rest  '[]'$ History of DVT   '[]'$ Phlebitis   '[]'$ Swelling in legs   '[]'$ Varicose veins   '[]'$ Non-healing ulcers Pulmonary:   '[]'$ Uses home oxygen   '[]'$ Productive cough   '[]'$ Hemoptysis   '[]'$ Wheeze  '[]'$ COPD   '[]'$ Asthma Neurologic:  '[]'$ Dizziness   '[]'$ Seizures   '[]'$ History of stroke   '[]'$ History of TIA  '[]'$ Aphasia   '[]'$ Vissual changes   '[]'$ Weakness or numbness in arm   '[]'$ Weakness or numbness in leg Musculoskeletal:   '[]'$ Joint swelling   '[]'$ Joint pain   '[]'$ Low back pain Hematologic:  '[]'$ Easy bruising  '[]'$ Easy bleeding   '[]'$ Hypercoagulable state   '[]'$ Anemic Gastrointestinal:  '[]'$ Diarrhea   '[]'$ Vomiting  '[]'$ Gastroesophageal reflux/heartburn   '[]'$ Difficulty swallowing. Genitourinary:  '[]'$ Chronic kidney disease   '[]'$ Difficult urination  '[]'$ Frequent urination   '[]'$ Blood in urine Skin:  '[]'$ Rashes   '[]'$ Ulcers  Psychological:  '[]'$ History of anxiety   '[]'$  History of major depression.  Physical Examination  There were no vitals filed for this visit. There is no height or weight on file to calculate BMI. Gen: WD/WN, NAD Head: [place]/AT, No temporalis wasting.  Ear/Nose/Throat: Hearing grossly intact, nares w/o erythema or drainage Eyes: PER, EOMI, sclera nonicteric.  Neck: Supple, no masses.  No bruit or JVD.  Pulmonary:  Good air movement, no audible wheezing, no use of accessory muscles.   Cardiac: RRR, normal S1, S2, no Murmurs. Vascular:  well healed left CEA scar; bilateral carotid bruit Vessel Right Left  Radial Palpable Palpable  Carotid Palpable Palpable  Gastrointestinal: soft, non-distended. No guarding/no peritoneal signs.  Musculoskeletal: M/S 5/5 throughout.  No visible deformity.  Neurologic: CN 2-12 intact. Pain and light touch intact in extremities.  Symmetrical.  Speech is fluent. Motor exam as listed above. Psychiatric: Judgment intact, Mood & affect appropriate for pt's clinical situation. Dermatologic: No  rashes or ulcers noted.  No changes consistent with cellulitis.   CBC Lab Results  Component Value Date   WBC 9.2 07/28/2019   HGB 11.3 (L) 07/28/2019   HCT 35.9 (L) 07/28/2019   MCV 78.7 (L) 07/28/2019   PLT 202 07/28/2019    BMET    Component Value Date/Time   NA 141 07/28/2019 1146   NA 139 09/27/2018 0922   NA 139 12/14/2011 0731   K 3.9 07/28/2019 1146   K 4.5 12/14/2011 0731   CL 107 07/28/2019 1146   CL 103 12/14/2011 0731   CO2 25 07/28/2019 1146   CO2 30 12/14/2011 0731   GLUCOSE 94 07/28/2019 1146   GLUCOSE 126 (H) 12/14/2011 0731   BUN 18 07/28/2019 1146   BUN 15 09/27/2018 0922   BUN 14 12/14/2011 0731   CREATININE 1.09 (H) 07/28/2019 1146   CREATININE 1.03 12/14/2011 0731   CALCIUM 9.2 07/28/2019 1146   CALCIUM 9.1 12/14/2011 0731   GFRNONAA 54 (L) 07/28/2019 1146   GFRNONAA 59 (L) 12/14/2011 0731   GFRAA >60 07/28/2019 1146   GFRAA >60 12/14/2011 0731   CrCl cannot be calculated (Patient's most recent lab result is older than the maximum 21 days allowed.).  COAG No results found for: INR, PROTIME  Radiology No results found.   Assessment/Plan 1. Carotid stenosis, bilateral Recommend:   Given the patient's asymptomatic subcritical stenosis no further invasive testing or surgery at this time.   Carotid Duplex done today shows velocities consistent with a 1 to 39% stenosis in the left ICA and the the right  ICA.  Previously placed stent in the right carotid artery is patent.   Continue antiplatelet therapy as prescribed Continue management of CAD, HTN and Hyperlipidemia Healthy heart diet,  encouraged exercise at least 4 times per week Follow up in 12 months with duplex ultrasound and physical exam - VAS US CAROTID; Future  2. Essential hypertension Continue antihypertensive medications as already ordered, these medications have been reviewed and there are no changes at this time.   3. Chronic obstructive pulmonary disease, unspecified COPD type ([place]) Continue pulmonary medications and aerosols as already ordered, these medications have been reviewed and there are no changes at this time.    4. Mixed hyperlipidemia Continue statin as ordered and reviewed, no changes at this time     Hortencia Pilar, MD  04/20/2021 2:06 PM

## 2021-04-21 ENCOUNTER — Other Ambulatory Visit: Payer: Self-pay

## 2021-04-21 ENCOUNTER — Ambulatory Visit (INDEPENDENT_AMBULATORY_CARE_PROVIDER_SITE_OTHER): Payer: Medicare Other

## 2021-04-21 ENCOUNTER — Ambulatory Visit (INDEPENDENT_AMBULATORY_CARE_PROVIDER_SITE_OTHER): Payer: Medicare Other | Admitting: Vascular Surgery

## 2021-04-21 ENCOUNTER — Encounter (INDEPENDENT_AMBULATORY_CARE_PROVIDER_SITE_OTHER): Payer: Self-pay | Admitting: Vascular Surgery

## 2021-04-21 VITALS — BP 165/84 | HR 57 | Resp 16 | Wt 142.2 lb

## 2021-04-21 DIAGNOSIS — J449 Chronic obstructive pulmonary disease, unspecified: Secondary | ICD-10-CM

## 2021-04-21 DIAGNOSIS — I6523 Occlusion and stenosis of bilateral carotid arteries: Secondary | ICD-10-CM | POA: Diagnosis not present

## 2021-04-21 DIAGNOSIS — I10 Essential (primary) hypertension: Secondary | ICD-10-CM

## 2021-04-21 DIAGNOSIS — E782 Mixed hyperlipidemia: Secondary | ICD-10-CM | POA: Diagnosis not present

## 2021-04-25 ENCOUNTER — Other Ambulatory Visit: Payer: Self-pay | Admitting: Internal Medicine

## 2021-04-25 DIAGNOSIS — Z1231 Encounter for screening mammogram for malignant neoplasm of breast: Secondary | ICD-10-CM

## 2021-05-05 ENCOUNTER — Ambulatory Visit
Admission: RE | Admit: 2021-05-05 | Discharge: 2021-05-05 | Disposition: A | Discharge status: Home / Self Care | Payer: Medicare Other | Source: Ambulatory Visit | Attending: Internal Medicine | Admitting: Internal Medicine

## 2021-05-05 ENCOUNTER — Other Ambulatory Visit: Payer: Self-pay

## 2021-05-05 DIAGNOSIS — Z1231 Encounter for screening mammogram for malignant neoplasm of breast: Secondary | ICD-10-CM | POA: Insufficient documentation

## 2021-06-11 ENCOUNTER — Ambulatory Visit: Payer: Medicare Other | Admitting: Gastroenterology

## 2021-08-14 DIAGNOSIS — U071 COVID-19: Secondary | ICD-10-CM

## 2021-08-14 HISTORY — DX: COVID-19: U07.1

## 2021-09-25 ENCOUNTER — Other Ambulatory Visit: Payer: Self-pay

## 2021-09-25 ENCOUNTER — Ambulatory Visit (INDEPENDENT_AMBULATORY_CARE_PROVIDER_SITE_OTHER): Payer: Medicare Other | Admitting: Gastroenterology

## 2021-09-25 ENCOUNTER — Encounter: Payer: Self-pay | Admitting: Gastroenterology

## 2021-09-25 VITALS — BP 178/75 | HR 67 | Temp 98.4°F | Ht 64.0 in | Wt 139.2 lb

## 2021-09-25 DIAGNOSIS — Z862 Personal history of diseases of the blood and blood-forming organs and certain disorders involving the immune mechanism: Secondary | ICD-10-CM

## 2021-09-25 DIAGNOSIS — R110 Nausea: Secondary | ICD-10-CM | POA: Diagnosis not present

## 2021-09-25 NOTE — Progress Notes (Signed)
Nicole Darby, MD [address]  [place], [place] [address]  Main: [phone]  Fax: [phone]    Gastroenterology Consultation  Referring Provider:     Idelle Crouch, MD Primary Care Physician:  Idelle Crouch, MD Primary Gastroenterologist:  Dr. Cephas Herman Reason for Consultation:    Chronic nausea       HPI:   Nicole Herman is a 66 y.o. female referred by Dr. Doy Hutching, Leonie Douglas, MD  for consultation & management of reflux.  Patient reports she has been experiencing symptoms of regurgitation, burning in her chest, intermittent, sporadic left upper quadrant pain radiating to lower back.  She reports that her reflux symptoms have been ongoing for several years, has been maintained on Protonix 40 mg twice daily which keeps her symptoms under control.  She is followed by ENT, Dr. Pryor Ochoa for airway polyps.  She tried to switch to a different proton pump inhibitor, was not effective.  She also underwent carotid artery stent placement last month and has been on Plavix since then.  She is currently dealing with intermittent episodes of lightheadedness, dizziness with intermittent bradycardia for which she is seeing Dr. Clayborn Bigness.  She is an active smoker.  She denies black stools, blood in the stools, epigastric pain, nausea or vomiting.  Her last hemoglobin was 10.1 about a month ago, normal MCV.  She does not have chronic liver disease. She denies weight loss.  She does have history of colon polyps and underwent colonoscopy several years ago.  Follow-up visit 02/13/2020 Patient underwent removal of vocal cord polyps.  She did not follow-up regarding upper endoscopy and colonoscopy for iron deficiency anemia.  She is here today to discuss about procedures.  She is currently on over-the-counter iron 65 mg 2 times daily resulting in dark stools.  Her most recent hemoglobin was 9.7 on 12/27/2019, MCV 78.1, ferritin 5, normal TSH.  She does report heartburn for which she is  taking Protonix 2 times a day before meals.   Follow-up visit 09/25/2021 Patient is here to discuss about nausea that has started several months ago.  Her nausea is associated with headache and she has significant craving for ice.  She does have history of iron deficiency anemia, currently not on any oral iron replacement therapy.  Patient did not undergo video capsule endoscopy as recommended.  She did not come for a follow-up appointment for more than a year.  Patient states that she went through significant amount of stress and anxiety when she found her boyfriend committed suicide and was found in her backyard.  She was dealing with managing his house.  She also had to go to [place] which was quite stressful.  She used to experience left upper quadrant discomfort, which has resolved.  She is currently taking Protonix 40 mg once a day, started by Dr. Doy Hutching.  She is also taking Zofran as needed for nausea which helps.  Patient denies any particular relation of food to nausea, denies early morning episodes.  Patient denies heartburn, regurgitation, weight loss.  Her weight has been stable since my last visit with her in 02/2020.  Patient denies any problems with her bowel movements.  Her labs from December revealed mild normocytic anemia only.  Patient had COVID infection, tested positive on 08/27/2021.  NSAIDs: None  Antiplts/Anticoagulants/Anti thrombotics: None  GI Procedures: Colonoscopy more than 10 years ago, found to have colon polyps EGD and colonoscopy 03/06/2020 for iron deficiency anemia  -  Normal examined duodenum. Biopsied. - Normal stomach. Biopsied. - Esophagogastric landmarks identified. - Salmon-colored mucosa suspicious for short-segment Barrett's esophagus. Biopsied.  - The examined portion of the ileum was normal. - Four 3 to 5 mm polyps at the recto-sigmoid colon, in the descending colon and in the cecum, removed with a cold snare. Resected and retrieved. - Two diminutive  polyps in the ascending colon, removed with a cold biopsy forceps. Resected and retrieved. - The distal rectum and anal verge are normal on retroflexion view.  DIAGNOSIS:  A. DUODENUM; COLD BIOPSY:  - ENTERIC MUCOSA WITH PRESERVED VILLOUS ARCHITECTURE AND NO SIGNIFICANT  HISTOPATHOLOGIC CHANGE.  - NEGATIVE FOR FEATURES OF CELIAC, DYSPLASIA, AND MALIGNANCY.   B. STOMACH, RANDOM; COLD BIOPSY:  - GASTRIC ANTRAL MUCOSA WITH FEATURES OF MILD REACTIVE GASTROPATHY.  - GASTRIC OXYNTIC MUCOSA WITH FEATURES SUGGESTIVE OF PPI EFFECT.  - NEGATIVE FOR H. PYLORI, DYSPLASIA, AND MALIGNANCY.   C. GASTROESOPHAGEAL JUNCTION; COLD BIOPSY:  - BENIGN SQUAMOUS MUCOSA WITH MILD ACANTHOSIS.  - NO COLUMNAR MUCOSA PRESENT TO EVALUATE FOR INTESTINAL METAPLASIA.  - NO INCREASE IN INTRAEPITHELIAL EOSINOPHILS (LESS THAN 2 PER HPF).  - NEGATIVE FOR DYSPLASIA AND MALIGNANCY.   D. COLON POLYP, CECUM; COLD SNARE:  - TUBULAR ADENOMA.  - NEGATIVE FOR HIGH-GRADE DYSPLASIA AND MALIGNANCY.   E. COLON POLYPS X2, ASCENDING; COLD BIOPSY:  - FRAGMENTS (X2) OF BENIGN COLONIC MUCOSA WITH SUPERFICIAL REACTIVE  CHANGES.  - NEGATIVE FOR DYSPLASIA OR MALIGNANCY.   Past Medical History:  Diagnosis Date   Airway polyps 2017   Anemia    Anxiety    Carotid artery stenosis, unilateral, right 07/2019   Colon polyps    Complication of anesthesia    Coronary artery disease    s/p carotid surgery left    Does use hearing aid    Dyspnea    d/t vocal cord polyps   Dysrhythmia    bradycardia when in hospital getting stent placed   Fibromyalgia    GERD (gastroesophageal reflux disease)    Granuloma annulare    Heart murmur    born with it, no treatment   History of chicken pox    Hypertension    PONV (postoperative nausea and vomiting)    Thyroid disease     Past Surgical History:  Procedure Laterality Date   ABDOMINAL HYSTERECTOMY     1985   CAROTID ENDARTERECTOMY     left    CAROTID PTA/STENT INTERVENTION Right  03/22/2019   Procedure: CAROTID PTA/STENT INTERVENTION;  Surgeon: Katha Cabal, MD;  Location: [facility];  Service: Cardiovascular;  Laterality: Right;   CARPAL TUNNEL RELEASE     x2   CESAREAN SECTION     1982   COLONOSCOPY WITH PROPOFOL N/A 03/06/2020   Procedure: COLONOSCOPY WITH PROPOFOL;  Surgeon: Lin Landsman, MD;  Location: [facility];  Service: Gastroenterology;  Laterality: N/A;   ESOPHAGOGASTRODUODENOSCOPY (EGD) WITH PROPOFOL N/A 03/06/2020   Procedure: ESOPHAGOGASTRODUODENOSCOPY (EGD) WITH PROPOFOL;  Surgeon: Lin Landsman, MD;  Location: [facility];  Service: Gastroenterology;  Laterality: N/A;   OOPHORECTOMY     1/2 ovaries removed    STAPEDES SURGERY     otosclerosis right .  prostetic in right.   TRIGGER FINGER RELEASE      Current Outpatient Medications:    ascorbic acid (VITAMIN C) 500 MG tablet, Take by mouth., Disp: , Rfl:    aspirin 81 MG chewable tablet, Chew 81 mg by mouth daily. , Disp: , Rfl:  atorvastatin (LIPITOR) 10 MG tablet, Take 10 mg by mouth daily., Disp: , Rfl:    clopidogrel (PLAVIX) 75 MG tablet, Take 1 tablet (75 mg total) by mouth daily., Disp: 90 tablet, Rfl: 3   hydrochlorothiazide (HYDRODIURIL) 25 MG tablet, Take 25 mg by mouth daily., Disp: , Rfl:    levothyroxine (SYNTHROID) 75 MCG tablet, Take by mouth., Disp: , Rfl:    ondansetron (ZOFRAN) 4 MG tablet, Take 4 mg by mouth every 8 (eight) hours as needed., Disp: , Rfl:    pantoprazole (PROTONIX) 40 MG tablet, Take 1 tablet (40 mg total) by mouth 2 (two) times daily before a meal. 30 minutes before meal, Disp: 180 tablet, Rfl: 1   telmisartan (MICARDIS) 40 MG tablet, Take 0.5 tablets (20 mg total) by mouth daily. Take 1/2 tab po daily (Patient taking differently: Take 20 mg by mouth daily.), Disp: 45 tablet, Rfl: 3   traZODone (DESYREL) 50 MG tablet, Take 1 tablet by mouth at bedtime., Disp: , Rfl:     Family History  Problem Relation Age of Onset    Hypertension Mother    Dementia Mother    Arthritis Mother    Hyperlipidemia Mother    Cancer Father        colon    Early death Father    Learning disabilities Brother    Colon polyps Brother    Hyperlipidemia Brother    Hypertension Brother    Heart attack Maternal Grandmother    Arthritis Maternal Grandmother    Heart disease Maternal Grandmother    Colon polyps Sister    Lupus Sister    Heart disease Maternal Grandfather    Heart disease Paternal Grandmother    Cancer Other      Social History   Tobacco Use   Smoking status: Light Smoker    Packs/day: 0.25    Types: Cigarettes   Smokeless tobacco: Never  Vaping Use   Vaping Use: Former  Substance Use Topics   Alcohol use: No   Drug use: No    Allergies as of 09/25/2021 - Review Complete 09/25/2021  Allergen Reaction Noted   Chantix [varenicline tartrate]  03/15/2019   Atorvastatin Other (See Comments) 09/26/2019   Penicillins Rash 09/16/2017   Wellbutrin [bupropion]  03/15/2019    Review of Systems:    All systems reviewed and negative except where noted in HPI.   Physical Exam:  BP (!) 178/75 (BP Location: Left Arm, Patient Position: Sitting, Cuff Size: Normal)    Pulse 67    Temp 98.4 F (36.9 C) (Oral)    Ht 5\' 4"  (1.626 m)    Wt 139 lb 4 oz (63.2 kg)    BMI 23.90 kg/m  No LMP recorded. Patient has had a hysterectomy.  General:   Alert,  Well-developed, well-nourished, pleasant and cooperative in NAD Head:  Normocephalic and atraumatic. Eyes:  Sclera clear, no icterus.   Conjunctiva pink. Ears:  Normal auditory acuity. Nose:  No deformity, discharge, or lesions. Mouth:  No deformity or lesions,oropharynx pink & moist. Neck:  Supple; no masses or thyromegaly. Lungs:  Respirations even and unlabored.  Clear throughout to auscultation.   No wheezes, crackles, or rhonchi. No acute distress. Heart:  Regular rate and rhythm; no murmurs, clicks, rubs, or gallops. Abdomen:  Normal bowel sounds. Soft,  non-tender and non-distended without masses, hepatosplenomegaly or hernias noted.  No guarding or rebound tenderness.   Rectal: Not performed Msk:  Symmetrical without gross deformities. Good, equal movement & strength  bilaterally. Pulses:  Normal pulses noted. Extremities:  No clubbing or edema.  No cyanosis. Neurologic:  Alert and oriented x3;  grossly normal neurologically. Skin:  Intact without significant lesions or rashes. No jaundice. Psych:  Alert and cooperative. Normal mood and affect.  Imaging Studies: No abdominal imaging  Assessment and Plan:   Nicole Herman is a 66 y.o. female with chronic tobacco use, carotid artery stenosis, left carotid endarterectomy, right carotid artery stent placement on Plavix, chronic mild iron deficiency anemia, COVID-positive on 08/27/2021, s/p treatment is seen in consultation for more than 1 month history of nausea associated with headache and pica, nocturnal leg cramps  Chronic nausea Check H. pylori IgG, H. pylori breath test Continue Zofran as needed Continue Protonix 40 mg p.o. twice daily for 1 month Recommend EGD for further evaluation to rule out peptic ulcer disease.  Patient lives alone and she will call my office back to schedule the procedure when she could arrange a ride  I have discussed alternative options, risks & benefits,  which include, but are not limited to, bleeding, infection, perforation,respiratory complication & drug reaction.  The patient agrees with this plan & written consent will be obtained.     Severe iron deficiency anemia EGD and colonoscopy were unremarkable Recheck iron panel given presence of pica   Follow up after above work-up   Nicole Darby, MD

## 2021-09-25 NOTE — Patient Instructions (Signed)
Dr. Marius Ditch wants to schedule you for a EGD. Please call Caryl Pina at [phone]

## 2021-09-30 ENCOUNTER — Telehealth: Payer: Self-pay

## 2021-09-30 LAB — IRON,TIBC AND FERRITIN PANEL
Ferritin: 11 ng/mL — ABNORMAL LOW (ref 15–150)
Iron Saturation: 5 % — CL (ref 15–55)
Iron: 24 ug/dL — ABNORMAL LOW (ref 27–139)
Total Iron Binding Capacity: 474 ug/dL — ABNORMAL HIGH (ref 250–450)
UIBC: 450 ug/dL — ABNORMAL HIGH (ref 118–369)

## 2021-09-30 LAB — H. PYLORI ANTIBODY, IGG: H. pylori, IgG AbS: 0.14 Index Value (ref 0.00–0.79)

## 2021-09-30 MED ORDER — FUSION PLUS PO CAPS: 1.0000 | ORAL_CAPSULE | Freq: Every day | ORAL | 2 refills | Status: DC

## 2021-09-30 NOTE — Telephone Encounter (Signed)
Patient verbalized understanding of instructions. She states she still trying to find a ride to take her to her procedure but she will call us back as soon as she finds one

## 2021-09-30 NOTE — Telephone Encounter (Signed)
Called and left a message for call back. Sent Fusion plus to the pharmacy

## 2021-09-30 NOTE — Telephone Encounter (Signed)
-----   Message from Lin Landsman, MD sent at 09/30/2021  9:02 AM EST ----- Please inform pt that she has severe IDA which explains her pica,craving for ice, she should start iron supplements, have her pick up fusion samples. I still recommend she gets an EGD  RV

## 2021-10-01 LAB — H. PYLORI BREATH TEST: H pylori Breath Test: NEGATIVE

## 2021-10-02 ENCOUNTER — Other Ambulatory Visit: Payer: Self-pay

## 2021-10-02 ENCOUNTER — Encounter: Payer: Self-pay | Admitting: Emergency Medicine

## 2021-10-02 DIAGNOSIS — R195 Other fecal abnormalities: Secondary | ICD-10-CM | POA: Insufficient documentation

## 2021-10-02 DIAGNOSIS — D72829 Elevated white blood cell count, unspecified: Secondary | ICD-10-CM | POA: Insufficient documentation

## 2021-10-02 DIAGNOSIS — R42 Dizziness and giddiness: Secondary | ICD-10-CM | POA: Insufficient documentation

## 2021-10-02 DIAGNOSIS — K0889 Other specified disorders of teeth and supporting structures: Secondary | ICD-10-CM | POA: Insufficient documentation

## 2021-10-02 DIAGNOSIS — D649 Anemia, unspecified: Secondary | ICD-10-CM | POA: Diagnosis not present

## 2021-10-02 LAB — URINALYSIS, ROUTINE W REFLEX MICROSCOPIC
Bilirubin Urine: NEGATIVE
Glucose, UA: NEGATIVE mg/dL
Hgb urine dipstick: NEGATIVE
Ketones, ur: NEGATIVE mg/dL
Nitrite: NEGATIVE
Protein, ur: NEGATIVE mg/dL
Specific Gravity, Urine: 1.005 (ref 1.005–1.030)
pH: 5 (ref 5.0–8.0)

## 2021-10-02 LAB — COMPREHENSIVE METABOLIC PANEL
ALT: 16 U/L (ref 0–44)
AST: 25 U/L (ref 15–41)
Albumin: 3.9 g/dL (ref 3.5–5.0)
Alkaline Phosphatase: 89 U/L (ref 38–126)
Anion gap: 9 (ref 5–15)
BUN: 10 mg/dL (ref 8–23)
CO2: 23 mmol/L (ref 22–32)
Calcium: 9.2 mg/dL (ref 8.9–10.3)
Chloride: 104 mmol/L (ref 98–111)
Creatinine, Ser: 1.01 mg/dL — ABNORMAL HIGH (ref 0.44–1.00)
GFR, Estimated: >60 mL/min (ref >60)
Glucose, Bld: 110 mg/dL — ABNORMAL HIGH (ref 70–99)
Potassium: 3.6 mmol/L (ref 3.5–5.1)
Sodium: 136 mmol/L (ref 135–145)
Total Bilirubin: 0.4 mg/dL (ref 0.3–1.2)
Total Protein: 6.6 g/dL (ref 6.5–8.1)

## 2021-10-02 LAB — CBC
HCT: 31.2 % — ABNORMAL LOW (ref 36.0–46.0)
Hemoglobin: 9.9 g/dL — ABNORMAL LOW (ref 12.0–15.0)
MCH: 25 pg — ABNORMAL LOW (ref 26.0–34.0)
MCHC: 31.7 g/dL (ref 30.0–36.0)
MCV: 78.8 fL — ABNORMAL LOW (ref 80.0–100.0)
Platelets: 258 10*3/uL (ref 150–400)
RBC: 3.96 MIL/uL (ref 3.87–5.11)
RDW: 16.5 % — ABNORMAL HIGH (ref 11.5–15.5)
WBC: 7.4 10*3/uL (ref 4.0–10.5)
nRBC: 0 % (ref 0.0–0.2)

## 2021-10-02 LAB — TROPONIN I (HIGH SENSITIVITY): Troponin I (High Sensitivity): 6 ng/L (ref <18)

## 2021-10-02 LAB — LIPASE, BLOOD: Lipase: 41 U/L (ref 11–51)

## 2021-10-02 NOTE — ED Triage Notes (Addendum)
Patient ambulatory to triage with steady gait, without difficulty or distress noted; pt reports dizziness and nausea since yesterday; st has "been under a lot of stress recently"

## 2021-10-03 ENCOUNTER — Emergency Department
Admission: EM | Admit: 2021-10-03 | Discharge: 2021-10-03 | Disposition: A | Discharge status: Home / Self Care | Payer: Medicare Other | Attending: Emergency Medicine | Admitting: Emergency Medicine

## 2021-10-03 DIAGNOSIS — R110 Nausea: Secondary | ICD-10-CM

## 2021-10-03 DIAGNOSIS — R42 Dizziness and giddiness: Secondary | ICD-10-CM

## 2021-10-03 DIAGNOSIS — K0889 Other specified disorders of teeth and supporting structures: Secondary | ICD-10-CM

## 2021-10-03 LAB — TROPONIN I (HIGH SENSITIVITY): Troponin I (High Sensitivity): 5 ng/L (ref <18)

## 2021-10-03 MED ORDER — CLINDAMYCIN HCL 300 MG PO CAPS: 300.0000 mg | ORAL_CAPSULE | Freq: Three times a day (TID) | ORAL | 0 refills | Status: AC

## 2021-10-03 MED ORDER — FOSFOMYCIN TROMETHAMINE 3 G PO PACK
3.0000 g | PACK | Freq: Once | ORAL | Status: AC
  Administered 2021-10-03: 3 g via ORAL
  Filled 2021-10-03: qty 3

## 2021-10-03 NOTE — ED Provider Notes (Signed)
[facility] Provider Note    Event Date/Time   First MD Initiated Contact with Patient 10/03/21 0112     (approximate)   History   Dizziness   HPI  Nicole Herman is a 66 y.o. female   with chronic nausea, airway polyps, carotid artery stenosis status post stent placement who comes in with concerns for dizziness and nausea that started yesterday per triage note.  According to patient however she has had intermittent nausea over the past few months.  She has been actively followed by GI.  She did have COVID during this time.  And they thought that she could have long COVID.  She reports a lot of stress given her boyfriend shot himself in the head and he died in her backyard.  She denies any SI herself and was recently started on Zoloft but reports not taking it due to having all this nausea she wants to make sure that is not going to cause a medication reaction.  She is also not been taking her blood pressure medicine.  She denies any dizziness right now or nausea right now.  She was able to eat a muffin recently without any issues.  However yesterday she had an episode where she went outside in the light was really sensitive to her eyes and she felt dizzy like she was going to pass out but never did pass out.  She does report having a craving for ice but was recently told that she is iron deficient anemia and was prescribed iron.  She reports normal brown stools and just started iron pills which did darken the stool slightly.  She has been only on this for 2 days.  She reports that because of all the craving of the ice that she chipped one of her upper second molar tooth and that it was slightly out and she had to push it back in.  She is been trying to get a dentist to follow-up with.   I reviewed a GI note from 09/25/2021 by Dr. Marius Ditch for patient's chronic nausea.  Patient also underwent carotid artery stent placement 2 years ago has been on Plavix.  She has intermittent  episodes of lightheadedness and dizziness with intermittent bradycardia for which she sees Dr. Clayborn Bigness.    Physical Exam   Triage Vital Signs: ED Triage Vitals  Enc Vitals Group     BP 10/02/21 1912 (!) 159/84     Pulse Rate 10/02/21 1912 83     Resp 10/02/21 1912 18     Temp 10/02/21 1912 98.9 F (37.2 C)     Temp Source 10/02/21 1912 Oral     SpO2 10/02/21 1912 97 %     Weight 10/02/21 1916 133 lb (60.3 kg)     Height 10/02/21 1916 5\' 4"  (1.626 m)     Head Circumference --      Peak Flow --      Pain Score 10/02/21 1915 0     Pain Loc --      Pain Edu? --      Excl. in [place]? --     Most recent vital signs: Vitals:   10/02/21 1912 10/03/21 0010  BP: (!) 159/84 (!) 188/87  Pulse: 83 69  Resp: 18 18  Temp: 98.9 F (37.2 C) 98.2 F (36.8 C)  SpO2: 97% 98%     General: Awake, no distress.   CV:  Good peripheral perfusion.  Oropharynx: Loose tooth noted in the back  of the upper right.  Her second molar. Resp:  Normal effort.  Abd:  No distention.  Nontender Other:  Cranial nerves II to XII are intact.  Equal strength in arms and legs.  Finger-nose intact.  Heel-to-shin intact.  Ambulatory without any ataxia.   ED Results / Procedures / Treatments   Labs (all labs ordered are listed, but only abnormal results are displayed) Labs Reviewed  CBC - Abnormal; Notable for the following components:      Result Value   Hemoglobin 9.9 (*)    HCT 31.2 (*)    MCV 78.8 (*)    MCH 25.0 (*)    RDW 16.5 (*)    All other components within normal limits  COMPREHENSIVE METABOLIC PANEL - Abnormal; Notable for the following components:   Glucose, Bld 110 (*)    Creatinine, Ser 1.01 (*)    All other components within normal limits  URINALYSIS, ROUTINE W REFLEX MICROSCOPIC - Abnormal; Notable for the following components:   Color, Urine YELLOW (*)    APPearance CLEAR (*)    Leukocytes,Ua LARGE (*)    Bacteria, UA MANY (*)    All other components within normal limits  LIPASE,  BLOOD  TROPONIN I (HIGH SENSITIVITY)  TROPONIN I (HIGH SENSITIVITY)     EKG  My interpretation of EKG:  My interpretation is sinus rate of 77 without any ST elevation or T wave inversions, normal intervals   PROCEDURES:  Critical Care performed: No  Procedures   MEDICATIONS ORDERED IN ED: Medications - No data to display   IMPRESSION / MDM / [place] / ED COURSE  I reviewed the triage vital signs and the nursing notes.  Patient with multiple concerns but most notably was the episode of dizziness yesterday.  Her cranial nerves are intact I have low suspicion for stroke or intracranial process. Differential diagnosis includes, but is not limited to, infection, UTI, electrolyte abnormality, AKI, anemia    CBC shows normal white count no signs of infection.  Hemoglobin is low at 9.9.  Last check was 11.5 about a month ago but previously she has been about 9.7.  She is known to have iron deficient anemia.  Patient is only been on iron for 2 days.  Still needs more time for this to take effect.  I did a Hemoccult test that was brown stool and negative  Her CMP shows a kidney function that is slightly elevated at 1.01 but stable from baseline 2 years ago  Her urine looks concerning for UTI with large leukocytes and many bacteria.  Will send for culture.  She initially denied any symptoms but then stated that she had a little bit of pressure.  We will give a dose of fosfomycin  Her cardiac marker is negative x2 so low suspicion for ACS  Patient requesting some antibiotics for her tooth given she has not been able to follow-up.  She given her allergy to amoxicillin.  We will start some clindamycin.  I considered admission for patient given the dizziness and patient's age with her comorbidities but at this time she is not dizzy her cardiac markers are negative x2 and her EKG is without evidence of arrhythmia.  Patient denies any SI.  She feels comfortable with discharge  home.  These issues seem more chronic in nature and she will continue to monitor them at home.         FINAL CLINICAL IMPRESSION(S) / ED DIAGNOSES   Final diagnoses:  Dizziness  Nausea  Tooth pain     Rx / DC Orders   ED Discharge Orders          Ordered    clindamycin (CLEOCIN) 300 MG capsule  3 times daily        10/03/21 0157             Note:  This document was prepared using Dragon voice recognition software and may include unintentional dictation errors.   Vanessa Akhiok, MD 10/03/21 [phone]

## 2021-10-03 NOTE — Discharge Instructions (Signed)
We gave you dose of antibiotics to treat a potential UTI.  We have also given you some clindamycin to help prevent tooth infection.  Please call your dentist to get a follow-up.  Your blood pressure is elevated and this needs to be addressed.  You should restart your blood pressure medicine.  You should discuss further with your primary care doctor given your multiple issues that been going on for that amount of time.  It is important that you continue taking the iron due to your low iron levels and schedule the endoscopy as your GI doctor as planned.  Return to the ER if you develop worsening weakness, confusion or any other concerns.

## 2021-10-09 ENCOUNTER — Telehealth: Payer: Self-pay

## 2021-10-09 NOTE — Telephone Encounter (Signed)
Patient states she is feeling so much better since she taking the iron pill and is feeling so much better. She found someone to drive her to her colonoscopy and EGD but is going to have to have a tooth pulled and  needs to do that first. She will call us back to schedule procedures

## 2021-10-15 ENCOUNTER — Encounter: Payer: Self-pay | Admitting: *Deleted

## 2021-10-27 ENCOUNTER — Telehealth: Payer: Self-pay

## 2021-10-27 NOTE — Discharge Instructions (Signed)

## 2021-10-27 NOTE — Telephone Encounter (Signed)
Patient states she is feeling better with the iron pill. Patient states she will call and schedule the procedures after both of her Cataract surgeries

## 2021-10-29 ENCOUNTER — Encounter
Admission: RE | Disposition: A | Discharge status: Home / Self Care | Payer: Self-pay | Source: Home / Self Care | Attending: Ophthalmology

## 2021-10-29 ENCOUNTER — Other Ambulatory Visit: Payer: Self-pay

## 2021-10-29 ENCOUNTER — Encounter: Payer: Self-pay | Admitting: Ophthalmology

## 2021-10-29 ENCOUNTER — Ambulatory Visit
Admission: RE | Admit: 2021-10-29 | Discharge: 2021-10-29 | Disposition: A | Discharge status: Home / Self Care | Payer: Medicare Other | Attending: Ophthalmology | Admitting: Ophthalmology

## 2021-10-29 ENCOUNTER — Ambulatory Visit: Payer: Medicare Other | Admitting: Anesthesiology

## 2021-10-29 DIAGNOSIS — K219 Gastro-esophageal reflux disease without esophagitis: Secondary | ICD-10-CM | POA: Diagnosis not present

## 2021-10-29 DIAGNOSIS — H2512 Age-related nuclear cataract, left eye: Secondary | ICD-10-CM | POA: Diagnosis not present

## 2021-10-29 DIAGNOSIS — F419 Anxiety disorder, unspecified: Secondary | ICD-10-CM | POA: Insufficient documentation

## 2021-10-29 DIAGNOSIS — Z7902 Long term (current) use of antithrombotics/antiplatelets: Secondary | ICD-10-CM | POA: Diagnosis not present

## 2021-10-29 DIAGNOSIS — Z79899 Other long term (current) drug therapy: Secondary | ICD-10-CM | POA: Insufficient documentation

## 2021-10-29 DIAGNOSIS — Z79890 Hormone replacement therapy (postmenopausal): Secondary | ICD-10-CM | POA: Diagnosis not present

## 2021-10-29 DIAGNOSIS — Z8616 Personal history of COVID-19: Secondary | ICD-10-CM | POA: Insufficient documentation

## 2021-10-29 DIAGNOSIS — J449 Chronic obstructive pulmonary disease, unspecified: Secondary | ICD-10-CM | POA: Insufficient documentation

## 2021-10-29 DIAGNOSIS — E039 Hypothyroidism, unspecified: Secondary | ICD-10-CM | POA: Insufficient documentation

## 2021-10-29 DIAGNOSIS — Z7982 Long term (current) use of aspirin: Secondary | ICD-10-CM | POA: Insufficient documentation

## 2021-10-29 DIAGNOSIS — I2510 Atherosclerotic heart disease of native coronary artery without angina pectoris: Secondary | ICD-10-CM | POA: Diagnosis not present

## 2021-10-29 DIAGNOSIS — F17210 Nicotine dependence, cigarettes, uncomplicated: Secondary | ICD-10-CM | POA: Insufficient documentation

## 2021-10-29 DIAGNOSIS — I10 Essential (primary) hypertension: Secondary | ICD-10-CM | POA: Insufficient documentation

## 2021-10-29 DIAGNOSIS — M797 Fibromyalgia: Secondary | ICD-10-CM | POA: Diagnosis not present

## 2021-10-29 HISTORY — DX: Family history of other specified conditions: Z84.89

## 2021-10-29 HISTORY — DX: Congenital malformation of ear, unspecified: Q17.9

## 2021-10-29 HISTORY — PX: CATARACT EXTRACTION W/PHACO: SHX586

## 2021-10-29 HISTORY — DX: Nonrheumatic mitral (valve) insufficiency: I34.0

## 2021-10-29 SURGERY — PHACOEMULSIFICATION, CATARACT, WITH IOL INSERTION
Anesthesia: Monitor Anesthesia Care | Site: Eye | Laterality: Left

## 2021-10-29 MED ORDER — ACETAMINOPHEN 160 MG/5ML PO SOLN: 325.0000 mg | ORAL | Status: DC | PRN

## 2021-10-29 MED ORDER — BRIMONIDINE TARTRATE-TIMOLOL 0.2-0.5 % OP SOLN
OPHTHALMIC | Status: DC | PRN
  Administered 2021-10-29: 1 [drp] via OPHTHALMIC

## 2021-10-29 MED ORDER — SIGHTPATH DOSE#1 BSS IO SOLN
INTRAOCULAR | Status: DC | PRN
  Administered 2021-10-29: 1 mL via INTRAMUSCULAR

## 2021-10-29 MED ORDER — ARMC OPHTHALMIC DILATING DROPS
1.0000 "application " | OPHTHALMIC | Status: DC | PRN
  Administered 2021-10-29 (×3): 1 via OPHTHALMIC

## 2021-10-29 MED ORDER — TETRACAINE HCL 0.5 % OP SOLN
1.0000 [drp] | OPHTHALMIC | Status: DC | PRN
  Administered 2021-10-29 (×3): 1 [drp] via OPHTHALMIC

## 2021-10-29 MED ORDER — LACTATED RINGERS IV SOLN: INTRAVENOUS | Status: DC

## 2021-10-29 MED ORDER — ACETAMINOPHEN 325 MG PO TABS: 325.0000 mg | ORAL_TABLET | ORAL | Status: DC | PRN

## 2021-10-29 MED ORDER — FENTANYL CITRATE (PF) 100 MCG/2ML IJ SOLN
INTRAMUSCULAR | Status: DC | PRN
  Administered 2021-10-29 (×2): 50 ug via INTRAVENOUS

## 2021-10-29 MED ORDER — MIDAZOLAM HCL 2 MG/2ML IJ SOLN
INTRAMUSCULAR | Status: DC | PRN
  Administered 2021-10-29 (×2): 1 mg via INTRAVENOUS

## 2021-10-29 MED ORDER — MOXIFLOXACIN HCL 0.5 % OP SOLN
OPHTHALMIC | Status: DC | PRN
  Administered 2021-10-29: 0.2 mL via OPHTHALMIC

## 2021-10-29 MED ORDER — SIGHTPATH DOSE#1 BSS IO SOLN
INTRAOCULAR | Status: DC | PRN
  Administered 2021-10-29: 15 mL

## 2021-10-29 MED ORDER — SIGHTPATH DOSE#1 NA HYALUR & NA CHOND-NA HYALUR IO KIT
PACK | INTRAOCULAR | Status: DC | PRN
  Administered 2021-10-29: 1 via OPHTHALMIC

## 2021-10-29 MED ORDER — ONDANSETRON HCL 4 MG/2ML IJ SOLN: 4.0000 mg | Freq: Once | INTRAMUSCULAR | Status: DC | PRN

## 2021-10-29 MED ORDER — ONDANSETRON HCL 4 MG/2ML IJ SOLN
INTRAMUSCULAR | Status: DC | PRN
  Administered 2021-10-29: 4 mg via INTRAVENOUS

## 2021-10-29 MED ORDER — EPINEPHRINE PF 1 MG/ML IJ SOLN
INTRAMUSCULAR | Status: DC | PRN
  Administered 2021-10-29: 71 mL via OPHTHALMIC

## 2021-10-29 SURGICAL SUPPLY — 11 items
CATARACT SUITE SIGHTPATH (MISCELLANEOUS) ×2 IMPLANT
FEE CATARACT SUITE SIGHTPATH (MISCELLANEOUS) ×1 IMPLANT
GLOVE SRG 8 PF TXTR STRL LF DI (GLOVE) ×1 IMPLANT
GLOVE SURG ENC TEXT LTX SZ7.5 (GLOVE) ×2 IMPLANT
GLOVE SURG UNDER POLY LF SZ8 (GLOVE) ×2
LENS IOL TECNIS EYHANCE 21.5 (Intraocular Lens) ×1 IMPLANT
NDL FILTER BLUNT 18X1 1/2 (NEEDLE) ×1 IMPLANT
NEEDLE FILTER BLUNT 18X 1/2SAF (NEEDLE) ×1
NEEDLE FILTER BLUNT 18X1 1/2 (NEEDLE) ×1 IMPLANT
SYR 3ML LL SCALE MARK (SYRINGE) ×2 IMPLANT
WATER STERILE IRR 250ML POUR (IV SOLUTION) ×2 IMPLANT

## 2021-10-29 NOTE — Anesthesia Postprocedure Evaluation (Signed)
Anesthesia Post Note  Patient: Nicole Herman  Procedure(s) Performed: CATARACT EXTRACTION PHACO AND INTRAOCULAR LENS PLACEMENT (IOC) LEFT (Left: Eye)     Patient location during evaluation: PACU Anesthesia Type: MAC Level of consciousness: awake and alert Pain management: pain level controlled Vital Signs Assessment: post-procedure vital signs reviewed and stable Respiratory status: spontaneous breathing, nonlabored ventilation, respiratory function stable and patient connected to nasal cannula oxygen Cardiovascular status: blood pressure returned to baseline and stable Postop Assessment: no apparent nausea or vomiting Anesthetic complications: no   No notable events documented.  Sinda Du

## 2021-10-29 NOTE — Anesthesia Procedure Notes (Signed)
Procedure Name: MAC Date/Time: 10/29/2021 2:47 PM Performed by: Cameron Ali, CRNA Pre-anesthesia Checklist: Patient identified, Emergency Drugs available, Suction available, Timeout performed and Patient being monitored Patient Re-evaluated:Patient Re-evaluated prior to induction Oxygen Delivery Method: Nasal cannula Placement Confirmation: positive ETCO2

## 2021-10-29 NOTE — Anesthesia Preprocedure Evaluation (Signed)
Anesthesia Evaluation  Patient identified by MRN, date of birth, ID band Patient awake    Reviewed: Allergy & Precautions, NPO status , Patient's Chart, lab work & pertinent test results  History of Anesthesia Complications (+) PONV, Family history of anesthesia reaction and history of anesthetic complications  Airway Mallampati: III  TM Distance: >3 FB Neck ROM: Full    Dental no notable dental hx.    Pulmonary shortness of breath and with exertion, COPD, Current Smoker and Patient abstained from smoking.,    Pulmonary exam normal breath sounds clear to auscultation       Cardiovascular Exercise Tolerance: Good hypertension, + CAD and + Peripheral Vascular Disease  Normal cardiovascular exam+ dysrhythmias + Valvular Problems/Murmurs  Rhythm:Regular Rate:Normal     Neuro/Psych PSYCHIATRIC DISORDERS Anxiety  Neuromuscular disease    GI/Hepatic Neg liver ROS, GERD  ,  Endo/Other  Hypothyroidism   Renal/GU negative Renal ROS  negative genitourinary   Musculoskeletal  (+) Arthritis , Fibromyalgia -  Abdominal   Peds negative pediatric ROS (+)  Hematology  (+) Blood dyscrasia, anemia ,   Anesthesia Other Findings   Reproductive/Obstetrics negative OB ROS                             Anesthesia Physical  Anesthesia Plan  ASA: III  Anesthesia Plan: MAC   Post-op Pain Management:    Induction: Intravenous  PONV Risk Score and Plan: 2 and Treatment may vary due to age or medical condition and Midazolam  Airway Management Planned: Nasal Cannula and Natural Airway  Additional Equipment:   Intra-op Plan:   Post-operative Plan:   Informed Consent: I have reviewed the patients History and Physical, chart, labs and discussed the procedure including the risks, benefits and alternatives for the proposed anesthesia with the patient or authorized representative who has indicated his/her  understanding and acceptance.     Dental advisory given  Plan Discussed with: CRNA and Surgeon  Anesthesia Plan Comments:         Anesthesia Quick Evaluation  Patient Active Problem List   Diagnosis Date Noted   [facility] DJD(carpometacarpal degenerative joint disease), localized primary, left 07/29/2020   Rotator cuff tendinitis, right 07/29/2020   Iron deficiency anemia due to chronic blood loss    Carotid stenosis, bilateral 12/25/2019   Gastroesophageal reflux disease without esophagitis 12/25/2019   Iron deficiency anemia secondary to inadequate dietary iron intake 12/25/2019   Panlobular emphysema ([place]) 12/25/2019   Bradycardia 03/28/2019   Osteoporosis 03/22/2019   Chronic obstructive pulmonary disease ([place]) 03/15/2019   Laryngitis 03/15/2019   Carotid stenosis 05/30/2018   SOB (shortness of breath) 09/15/2017   Hyperlipidemia 09/15/2017   Urinary tract infection 09/15/2017   Abnormal weight gain 09/15/2017   Hypothyroidism 09/15/2017   Vitamin D deficiency 09/15/2017   Generalized anxiety disorder 09/15/2017   Essential hypertension 09/15/2017   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   Occipital neuralgia of left side 06/09/2016   Tobacco abuse 06/09/2016    CBC Latest Ref Rng & Units 10/02/2021 07/28/2019 04/24/2019  WBC 4.0 - 10.5 K/uL 7.4 9.2 6.6  Hemoglobin 12.0 - 15.0 g/dL 9.9(L) 11.3(L) 11.2  Hematocrit 36.0 - 46.0 % 31.2(L) 35.9(L) 33.6(L)  Platelets 150 - 400 K/uL 258 202 193   BMP Latest Ref Rng & Units 10/02/2021 07/28/2019 03/24/2019  Glucose 70 - 99 mg/dL 110(H) 94 110(H)  BUN 8 - 23 mg/dL _0 Creatinine 0.44 - 1.00 mg/dL 1.01(H)  1.09(H) 0.78  BUN/Creat Ratio 12 - 28 - - -  Sodium 135 - 145 mmol/L 136 141 142  Potassium 3.5 - 5.1 mmol/L 3.6 3.9 3.9  Chloride 98 - 111 mmol/L 104 107 111  CO2 22 - 32 mmol/L _0 Calcium 8.9 - 10.3 mg/dL 9.2 9.2 8.6(L)    Risks and benefits of anesthesia discussed at length, patient or  surrogate demonstrates understanding. Appropriately NPO. Plan to proceed with anesthesia.  Champ Mungo, MD 10/29/21

## 2021-10-29 NOTE — Transfer of Care (Signed)
Immediate Anesthesia Transfer of Care Note  Patient: Nicole Herman  Procedure(s) Performed: CATARACT EXTRACTION PHACO AND INTRAOCULAR LENS PLACEMENT (IOC) LEFT (Left: Eye)  Patient Location: PACU  Anesthesia Type: MAC  Level of Consciousness: awake, alert  and patient cooperative  Airway and Oxygen Therapy: Patient Spontanous Breathing and Patient connected to supplemental oxygen  Post-op Assessment: Post-op Vital signs reviewed, Patient's Cardiovascular Status Stable, Respiratory Function Stable, Patent Airway and No signs of Nausea or vomiting  Post-op Vital Signs: Reviewed and stable  Complications: No notable events documented.

## 2021-10-29 NOTE — Op Note (Signed)
OPERATIVE NOTE  Nicole Herman [id number] 10/29/2021   PREOPERATIVE DIAGNOSIS:  Nuclear sclerotic cataract left eye. H25.12   POSTOPERATIVE DIAGNOSIS:    Nuclear sclerotic cataract left eye.     PROCEDURE:  Phacoemusification with posterior chamber intraocular lens placement of the left eye  Ultrasound time: Procedure(s) with comments: CATARACT EXTRACTION PHACO AND INTRAOCULAR LENS PLACEMENT (IOC) LEFT (Left) - 2.25 00:18.7  LENS:   Implant Name Type Inv. Item Serial No. Manufacturer Lot No. LRB No. Used Action  LENS IOL TECNIS EYHANCE 21.5 - [id number] Intraocular Lens LENS IOL TECNIS EYHANCE 21.5 [id number] [employer]  Left 1 Implanted      SURGEON:  Merleen Nicely, MD   ANESTHESIA:  Topical with tetracaine drops and 2% Xylocaine jelly, augmented with 1% preservative-free intracameral lidocaine.    COMPLICATIONS:  None.   DESCRIPTION OF PROCEDURE:  The patient was identified in the holding room and transported to the operating room and placed in the supine position under the operating microscope.  The left eye was identified as the operative eye and it was prepped and draped in the usual sterile ophthalmic fashion.   A 1 millimeter clear-corneal paracentesis was made at the inferotemporal position.  0.5 ml of preservative-free 1% lidocaine was injected into the anterior chamber.  The anterior chamber was filled with Viscoat viscoelastic.  A 2.4 millimeter keratome was used to make a near-clear corneal incision at the superotemporal position. A curvilinear capsulorrhexis was made with a cystotome and capsulorrhexis forceps.  Balanced salt solution was used to hydrodissect and hydrodelineate the nucleus.   Phacoemulsification was then used in tilt and tumble fashion to remove the lens nucleus and epinucleus.  The remaining cortex was then removed using the irrigation and aspiration handpiece. Provisc was then placed into the capsular bag to distend it for lens placement.  A lens was  then injected into the capsular bag.  The remaining viscoelastic was aspirated.   Wounds were hydrated with balanced salt solution.  The anterior chamber was inflated to a physiologic pressure with balanced salt solution.  No wound leaks were noted. 0.2 ml of Vigamox were injected intracamerally.  Timolol and Brimonidine drops were applied to the eye.  The patient was taken to the recovery room in stable condition without complications of anesthesia or surgery.  [name] 10/29/2021, 3:17 PM

## 2021-10-29 NOTE — H&P (Signed)
[facility]   Primary Care Physician:  Idelle Crouch, MD Ophthalmologist: Dr. Merleen Nicely  Pre-Procedure History & Physical: HPI:  Nicole Herman is a 65 y.o. female here for ophthalmic surgery.   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   Family history of adverse reaction to anesthesia    sister - PONV   Fibromyalgia    GERD (gastroesophageal reflux disease)    Granuloma annulare    Heart murmur    born with it, no treatment   History of chicken pox    Hypertension    Mitral valve regurgitation    PONV (postoperative nausea and vomiting)    Thyroid disease     Past Surgical History:  Procedure Laterality Date   ABDOMINAL HYSTERECTOMY     1985   CAROTID ENDARTERECTOMY     left    CAROTID PTA/STENT INTERVENTION Right 03/22/2019   Procedure: CAROTID PTA/STENT INTERVENTION;  Surgeon: Katha Cabal, MD;  Location: [place] [facility];  Service: Cardiovascular;  Laterality: Right;   CARPAL TUNNEL RELEASE     x2   CESAREAN SECTION     1982   COLONOSCOPY WITH PROPOFOL N/A 03/06/2020   Procedure: COLONOSCOPY WITH PROPOFOL;  Surgeon: Lin Landsman, MD;  Location: [facility] ENDOSCOPY;  Service: Gastroenterology;  Laterality: N/A;   ESOPHAGOGASTRODUODENOSCOPY (EGD) WITH PROPOFOL N/A 03/06/2020   Procedure: ESOPHAGOGASTRODUODENOSCOPY (EGD) WITH PROPOFOL;  Surgeon: Lin Landsman, MD;  Location: [facility] ENDOSCOPY;  Service: Gastroenterology;  Laterality: N/A;   OOPHORECTOMY     1/2 ovaries removed    STAPEDES SURGERY     otosclerosis right .  prostetic in  right.   TRIGGER FINGER RELEASE      Prior to Admission medications   Medication Sig Start Date End Date Taking? Authorizing Provider  ascorbic acid (VITAMIN C) 500 MG tablet Take by mouth.   Yes [provider]  aspirin 81 MG chewable tablet Chew 81 mg by mouth daily.    Yes [provider]  atorvastatin (LIPITOR) 10 MG tablet Take 10 mg by mouth daily. 09/22/21  Yes [provider]  clopidogrel (PLAVIX) 75 MG tablet Take 1 tablet (75 mg total) by mouth daily. 03/25/19  Yes Stegmayer, Joelene Millin A, PA-C  Iron-FA-B Cmp-C-Biot-Probiotic (FUSION PLUS) CAPS Take 1 capsule by mouth daily. 09/30/21  Yes Vanga, Tally Due, MD  levothyroxine (SYNTHROID) 75 MCG tablet Take by mouth. 07/15/21  Yes [provider]  ondansetron (ZOFRAN) 4 MG tablet Take 4 mg by mouth every 8 (eight) hours as needed. 09/17/21  Yes [provider]  pantoprazole (PROTONIX) 40 MG tablet Take 1 tablet (40 mg total) by mouth 2 (two) times daily before a meal. 30 minutes before meal 04/27/19  Yes McLean-Scocuzza, Nino Glow, MD  telmisartan (MICARDIS) 40 MG tablet Take 0.5 tablets (20 mg total) by mouth daily. Take 1/2 tab po daily Patient taking differently: Take 80 mg by mouth daily. 03/21/19  Yes  McLean-Scocuzza, Nino Glow, MD  traZODone (DESYREL) 50 MG tablet Take 1 tablet by mouth at bedtime. 04/24/21 04/24/22 Yes [provider]    Allergies as of 10/03/2021 - Review Complete 10/02/2021  Allergen Reaction Noted   Chantix [varenicline tartrate]  03/15/2019   Atorvastatin Other (See Comments) 09/26/2019   Penicillins Rash 09/16/2017   Wellbutrin [bupropion]  03/15/2019    Family History  Problem Relation Age of Onset   Hypertension Mother    Dementia Mother    Arthritis Mother    Hyperlipidemia Mother    Cancer Father        colon    Early death Father    Learning disabilities Brother    Colon polyps Brother    Hyperlipidemia Brother    Hypertension Brother    Heart  attack Maternal Grandmother    Arthritis Maternal Grandmother    Heart disease Maternal Grandmother    Colon polyps Sister    Lupus Sister    Heart disease Maternal Grandfather    Heart disease Paternal Grandmother    Cancer Other     Social History   Socioeconomic History   Marital status: Single    Spouse name: Not on file   Number of children: Not on file   Years of education: Not on file   Highest education level: Not on file  Occupational History   Occupation: was a Quarry manager    Comment: retired  Tobacco Use   Smoking status: Every Day    Packs/day: 1.00    Types: Cigarettes   Smokeless tobacco: Never  Vaping Use   Vaping Use: Former  Substance and Sexual Activity   Alcohol use: No    Comment: rare   Drug use: No   Sexual activity: Yes  Other Topics Concern   Not on file  Social History Narrative   1 son and 1 grandchild in [place] in [place]    1 fiance of 14 years as of 03/15/2019 (with h/o alcoholism, cardiac defibrillator, liver failure)    Was CNA   Social Determinants of Health   Financial Resource Strain: Not on file  Food Insecurity: Not on file  Transportation Needs: Not on file  Physical Activity: Not on file  Stress: Not on file  Social Connections: Not on file  Intimate Partner Violence: Not on file    Review of Systems: See HPI, otherwise negative ROS  Physical Exam: BP (!) 166/77    Pulse 66    Temp 99 F (37.2 C)    Ht 5\' 4"  (1.626 m)    Wt 62.1 kg    SpO2 98%    BMI 23.52 kg/m  General:   Alert,  pleasant and cooperative in NAD Head:  Normocephalic and atraumatic. Lungs:  Normal respiratory effort. Heart:  Regular rate and rhythm.  Impression/Plan: Nicole Herman is here for ophthalmic surgery.  Risks, benefits, limitations, and alternatives regarding ophthalmic surgery have been reviewed with the patient.  Questions have been answered.  All parties agreeable.   Leandrew Koyanagi, MD  10/29/2021, 1:32 PM

## 2021-10-30 ENCOUNTER — Encounter: Payer: Self-pay | Admitting: Ophthalmology

## 2021-11-10 NOTE — Discharge Instructions (Signed)

## 2021-11-12 ENCOUNTER — Ambulatory Visit
Admission: RE | Admit: 2021-11-12 | Discharge: 2021-11-12 | Disposition: A | Discharge status: Home / Self Care | Payer: Medicare Other | Attending: Ophthalmology | Admitting: Ophthalmology

## 2021-11-12 ENCOUNTER — Other Ambulatory Visit: Payer: Self-pay

## 2021-11-12 ENCOUNTER — Ambulatory Visit: Payer: Medicare Other | Admitting: Anesthesiology

## 2021-11-12 ENCOUNTER — Encounter: Payer: Self-pay | Admitting: Ophthalmology

## 2021-11-12 ENCOUNTER — Encounter
Admission: RE | Disposition: A | Discharge status: Home / Self Care | Payer: Self-pay | Source: Home / Self Care | Attending: Ophthalmology

## 2021-11-12 DIAGNOSIS — K219 Gastro-esophageal reflux disease without esophagitis: Secondary | ICD-10-CM | POA: Insufficient documentation

## 2021-11-12 DIAGNOSIS — D649 Anemia, unspecified: Secondary | ICD-10-CM | POA: Insufficient documentation

## 2021-11-12 DIAGNOSIS — F419 Anxiety disorder, unspecified: Secondary | ICD-10-CM | POA: Diagnosis not present

## 2021-11-12 DIAGNOSIS — D759 Disease of blood and blood-forming organs, unspecified: Secondary | ICD-10-CM | POA: Insufficient documentation

## 2021-11-12 DIAGNOSIS — F17210 Nicotine dependence, cigarettes, uncomplicated: Secondary | ICD-10-CM | POA: Diagnosis not present

## 2021-11-12 DIAGNOSIS — I2510 Atherosclerotic heart disease of native coronary artery without angina pectoris: Secondary | ICD-10-CM | POA: Diagnosis not present

## 2021-11-12 DIAGNOSIS — I10 Essential (primary) hypertension: Secondary | ICD-10-CM | POA: Insufficient documentation

## 2021-11-12 DIAGNOSIS — E039 Hypothyroidism, unspecified: Secondary | ICD-10-CM | POA: Insufficient documentation

## 2021-11-12 DIAGNOSIS — I739 Peripheral vascular disease, unspecified: Secondary | ICD-10-CM | POA: Diagnosis not present

## 2021-11-12 DIAGNOSIS — M797 Fibromyalgia: Secondary | ICD-10-CM | POA: Insufficient documentation

## 2021-11-12 DIAGNOSIS — H2511 Age-related nuclear cataract, right eye: Secondary | ICD-10-CM | POA: Insufficient documentation

## 2021-11-12 HISTORY — PX: CATARACT EXTRACTION W/PHACO: SHX586

## 2021-11-12 SURGERY — PHACOEMULSIFICATION, CATARACT, WITH IOL INSERTION
Anesthesia: Monitor Anesthesia Care | Site: Eye | Laterality: Right

## 2021-11-12 MED ORDER — ARMC OPHTHALMIC DILATING DROPS
1.0000 "application " | OPHTHALMIC | Status: DC | PRN
  Administered 2021-11-12 (×3): 1 via OPHTHALMIC

## 2021-11-12 MED ORDER — SIGHTPATH DOSE#1 NA HYALUR & NA CHOND-NA HYALUR IO KIT
PACK | INTRAOCULAR | Status: DC | PRN
  Administered 2021-11-12: 1 via OPHTHALMIC

## 2021-11-12 MED ORDER — SIGHTPATH DOSE#1 BSS IO SOLN
INTRAOCULAR | Status: DC | PRN
  Administered 2021-11-12: 1 mL via INTRAMUSCULAR

## 2021-11-12 MED ORDER — LACTATED RINGERS IV SOLN: INTRAVENOUS | Status: DC

## 2021-11-12 MED ORDER — TETRACAINE HCL 0.5 % OP SOLN
1.0000 [drp] | OPHTHALMIC | Status: DC | PRN
  Administered 2021-11-12 (×3): 1 [drp] via OPHTHALMIC

## 2021-11-12 MED ORDER — FENTANYL CITRATE (PF) 100 MCG/2ML IJ SOLN
INTRAMUSCULAR | Status: DC | PRN
  Administered 2021-11-12 (×2): 50 ug via INTRAVENOUS

## 2021-11-12 MED ORDER — MIDAZOLAM HCL 2 MG/2ML IJ SOLN
INTRAMUSCULAR | Status: DC | PRN
  Administered 2021-11-12 (×2): 1 mg via INTRAVENOUS

## 2021-11-12 MED ORDER — SIGHTPATH DOSE#1 BSS IO SOLN
INTRAOCULAR | Status: DC | PRN
  Administered 2021-11-12: 92 mL via OPHTHALMIC

## 2021-11-12 MED ORDER — MOXIFLOXACIN HCL 0.5 % OP SOLN
OPHTHALMIC | Status: DC | PRN
  Administered 2021-11-12: 0.2 mL via OPHTHALMIC

## 2021-11-12 MED ORDER — SIGHTPATH DOSE#1 BSS IO SOLN
INTRAOCULAR | Status: DC | PRN
Start: 2021-11-12 — End: 2021-11-12
  Administered 2021-11-12: 15 mL

## 2021-11-12 MED ORDER — BRIMONIDINE TARTRATE-TIMOLOL 0.2-0.5 % OP SOLN
OPHTHALMIC | Status: DC | PRN
  Administered 2021-11-12: 1 [drp] via OPHTHALMIC

## 2021-11-12 SURGICAL SUPPLY — 12 items
CATARACT SUITE SIGHTPATH (MISCELLANEOUS) ×2 IMPLANT
FEE CATARACT SUITE SIGHTPATH (MISCELLANEOUS) ×1 IMPLANT
GLOVE SRG 8 PF TXTR STRL LF DI (GLOVE) ×1 IMPLANT
GLOVE SURG ENC TEXT LTX SZ7.5 (GLOVE) ×2 IMPLANT
GLOVE SURG UNDER POLY LF SZ8 (GLOVE) ×2
LENS IOL TECNIS EYHANCE 21.5 (Intraocular Lens) ×1 IMPLANT
NDL FILTER BLUNT 18X1 1/2 (NEEDLE) ×1 IMPLANT
NEEDLE FILTER BLUNT 18X 1/2SAF (NEEDLE) ×1
NEEDLE FILTER BLUNT 18X1 1/2 (NEEDLE) ×1 IMPLANT
SYR 3ML LL SCALE MARK (SYRINGE) ×2 IMPLANT
TIP IRRIGATON/ASPIRATION (MISCELLANEOUS) ×1 IMPLANT
WATER STERILE IRR 250ML POUR (IV SOLUTION) ×2 IMPLANT

## 2021-11-12 NOTE — Anesthesia Preprocedure Evaluation (Addendum)
Anesthesia Evaluation  ?Patient identified by MRN, date of birth, ID band ?Patient awake ? ? ? ?Reviewed: ?Allergy & Precautions, NPO status , Patient's Chart, lab work & pertinent test results, reviewed documented beta blocker date and time  ? ?History of Anesthesia Complications ?(+) PONV, Family history of anesthesia reaction and history of anesthetic complications ? ?Airway ?Mallampati: II ? ?TM Distance: >3 FB ?Neck ROM: Full ? ? ? Dental ?no notable dental hx. ? ?  ?Pulmonary ?shortness of breath and with exertion, COPD, Current Smoker (Cig x3 this AM)Patient did not abstain from smoking.,  ?  ?Pulmonary exam normal ?breath sounds clear to auscultation ? ? ? ? ? ? Cardiovascular ?Exercise Tolerance: Good ?hypertension, (-) angina+ CAD and + Peripheral Vascular Disease  ?(-) DOE Normal cardiovascular exam+ dysrhythmias + Valvular Problems/Murmurs  ?Rhythm:Regular Rate:Normal ? ? ?  ?Neuro/Psych ?PSYCHIATRIC DISORDERS Anxiety  Neuromuscular disease   ? GI/Hepatic ?GERD  Controlled,  ?Endo/Other  ?Hypothyroidism  ? Renal/GU ?  ? ?  ?Musculoskeletal ? ?(+) Arthritis , Fibromyalgia - ? Abdominal ?  ?Peds ? Hematology ? ?(+) Blood dyscrasia, anemia ,   ?Anesthesia Other Findings ? ? Reproductive/Obstetrics ? ?  ? ? ? ? ? ? ? ? ? ? ? ? ? ?  ?  ? ? ? ? ? ? ? ?Anesthesia Physical ? ?Anesthesia Plan ? ?ASA: 3 ? ?Anesthesia Plan: MAC  ? ?Post-op Pain Management:   ? ?Induction: Intravenous ? ?PONV Risk Score and Plan: 2 and Treatment may vary due to age or medical condition, Midazolam and Ondansetron ? ?Airway Management Planned: Nasal Cannula and Natural Airway ? ?Additional Equipment:  ? ?Intra-op Plan:  ? ?Post-operative Plan:  ? ?Informed Consent: I have reviewed the patients History and Physical, chart, labs and discussed the procedure including the risks, benefits and alternatives for the proposed anesthesia with the patient or authorized representative who has indicated his/her  understanding and acceptance.  ? ? ? ?Dental advisory given ? ?Plan Discussed with: CRNA and Surgeon ? ?Anesthesia Plan Comments:   ? ? ? ? ? ?Anesthesia Quick Evaluation ? ?Patient Active Problem List  ? Diagnosis Date Noted  ?? [facility] DJD(carpometacarpal degenerative joint disease), localized primary, left 07/29/2020  ?? Rotator cuff tendinitis, right 07/29/2020  ?? Iron deficiency anemia due to chronic blood loss   ?? Carotid stenosis, bilateral 12/25/2019  ?? Gastroesophageal reflux disease without esophagitis 12/25/2019  ?? Iron deficiency anemia secondary to inadequate dietary iron intake 12/25/2019  ?? Panlobular emphysema ([place]) 12/25/2019  ?? Bradycardia 03/28/2019  ?? Osteoporosis 03/22/2019  ?? Chronic obstructive pulmonary disease ([place]) 03/15/2019  ?? Laryngitis 03/15/2019  ?? Carotid stenosis 05/30/2018  ?? SOB (shortness of breath) 09/15/2017  ?? Hyperlipidemia 09/15/2017  ?? Urinary tract infection 09/15/2017  ?? Abnormal weight gain 09/15/2017  ?? Hypothyroidism 09/15/2017  ?? Vitamin D deficiency 09/15/2017  ?? Generalized anxiety disorder 09/15/2017  ?? Essential hypertension 09/15/2017  ?? 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 Occipital neuralgia of left side 06/09/2016  ?? Tobacco abuse 06/09/2016  ? ? ?CBC Latest Ref Rng & Units 10/02/2021 07/28/2019 04/24/2019  ?WBC 4.0 - 10.5 K/uL 7.4 9.2 6.6  ?Hemoglobin 12.0 - 15.0 g/dL 9.9(L) 11.3(L) 11.2  ?Hematocrit 36.0 - 46.0 % 31.2(L) 35.9(L) 33.6(L)  ?Platelets 150 - 400 K/uL 258 202 193  ? ?BMP Latest Ref Rng & Units 10/02/2021 07/28/2019 03/24/2019  ?Glucose 70 - 99 mg/dL 110(H) 94 110(H)  ?BUN 8 - 23 mg/dL _0 ?Creatinine 0.44 - 1.00 mg/dL  1.01(H) 1.09(H) 0.78  ?BUN/Creat Ratio 12 - 28 - - -  ?Sodium 135 - 145 mmol/L 136 141 142  ?Potassium 3.5 - 5.1 mmol/L 3.6 3.9 3.9  ?Chloride 98 - 111 mmol/L 104 107 111  ?CO2 22 - 32 mmol/L _0 ?Calcium 8.9 - 10.3 mg/dL 9.2 9.2 8.6(L)  ? ? ?Risks and benefits of anesthesia discussed at length, patient or  surrogate demonstrates understanding. Appropriately NPO. Plan to proceed with anesthesia. ? ?

## 2021-11-12 NOTE — Anesthesia Postprocedure Evaluation (Signed)
Anesthesia Post Note ? ?Patient: Nicole Herman ? ?Procedure(s) Performed: CATARACT EXTRACTION PHACO AND INTRAOCULAR LENS PLACEMENT (IOC)  RIGHT (Right: Eye) ? ? ?  ?Patient location during evaluation: PACU ?Anesthesia Type: MAC ?Level of consciousness: awake and alert ?Pain management: pain level controlled ?Vital Signs Assessment: post-procedure vital signs reviewed and stable ?Respiratory status: spontaneous breathing, nonlabored ventilation, respiratory function stable and patient connected to nasal cannula oxygen ?Cardiovascular status: stable and blood pressure returned to baseline ?Postop Assessment: no apparent nausea or vomiting ?Anesthetic complications: no ? ? ?No notable events documented. ? ?Shanna Strength A  Vaanya Shambaugh ? ? ? ? ? ?

## 2021-11-12 NOTE — H&P (Signed)
[place]  ? ?Primary Care Physician:  Idelle Crouch, MD ?Ophthalmologist: Dr. Merleen Nicely, Dr. Leandrew Koyanagi ? ?Pre-Procedure History & Physical: ?HPI:  Nicole Herman is a 66 y.o. female here for ophthalmic surgery. ?  ?Past Medical History:  ?Diagnosis Date  ? Airway polyps 2017  ? Anemia   ? Anxiety   ? Carotid artery stenosis, unilateral, right 07/2019  ? Colon polyps   ? Complication of anesthesia   ? Coronary artery disease   ? s/p carotid surgery left   ? COVID-19 08/2021  ? Does use hearing aid   ? left  ? Dyspnea   ? d/t vocal cord polyps  ? Dysrhythmia   ? bradycardia when in hospital getting stent placed  ? Ear anomaly   ? 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 Family history of adverse reaction to anesthesia   ? sister - PONV  ? Fibromyalgia   ? GERD (gastroesophageal reflux disease)   ? Granuloma annulare   ? Heart murmur   ? born with it, no treatment  ? History of chicken pox   ? Hypertension   ? Mitral valve regurgitation   ? PONV (postoperative nausea and vomiting)   ? Thyroid disease   ? ? ?Past Surgical History:  ?Procedure Laterality Date  ? ABDOMINAL HYSTERECTOMY    ? 1985  ? CAROTID ENDARTERECTOMY    ? left   ? CAROTID PTA/STENT INTERVENTION Right 03/22/2019  ? Procedure: CAROTID PTA/STENT INTERVENTION;  Surgeon: Katha Cabal, MD;  Location: [place] [facility];  Service: Cardiovascular;  Laterality: Right;  ? CARPAL TUNNEL RELEASE    ? x2  ? CATARACT EXTRACTION W/PHACO Left 10/29/2021  ? Procedure: CATARACT EXTRACTION PHACO AND INTRAOCULAR LENS PLACEMENT ([place]) LEFT;  Surgeon: Leandrew Koyanagi, MD;  Location: [place];  Service: Ophthalmology;  Laterality: Left;  2.25 ?00:18.7  ? CESAREAN SECTION    ? 1982  ? COLONOSCOPY WITH PROPOFOL N/A 03/06/2020  ? Procedure: COLONOSCOPY WITH PROPOFOL;  Surgeon: Lin Landsman, MD;  Location: [facility] ENDOSCOPY;  Service: Gastroenterology;  Laterality: N/A;  ? ESOPHAGOGASTRODUODENOSCOPY (EGD) WITH PROPOFOL N/A  03/06/2020  ? Procedure: ESOPHAGOGASTRODUODENOSCOPY (EGD) WITH PROPOFOL;  Surgeon: Lin Landsman, MD;  [facility] ENDOSCOPY;  Service: Gastroenterology;  Laterality: N/A;  ? OOPHORECTOMY    ? 1/2 ovaries removed   ? STAPEDES SURGERY    ? otosclerosis right .  prostetic in right.  ? TRIGGER FINGER RELEASE    ? ? ?Prior to Admission medications   ?Medication Sig Start Date End Date Taking? Authorizing Provider  ?ascorbic acid (VITAMIN C) 500 MG tablet Take by mouth.   Yes [provider]  ?aspirin 81 MG chewable tablet Chew 81 mg by mouth daily.    Yes [provider]  ?atorvastatin (LIPITOR) 10 MG tablet Take 10 mg by mouth daily. 09/22/21  Yes [provider]  ?clopidogrel (PLAVIX) 75 MG tablet Take 1 tablet (75 mg total) by mouth daily. 03/25/19  Yes Stegmayer, Janalyn Harder, PA-C  ?Iron-FA-B Cmp-C-Biot-Probiotic (FUSION PLUS) CAPS Take 1 capsule by mouth daily. 09/30/21  Yes Vanga, Tally Due, MD  ?levothyroxine (SYNTHROID) 75 MCG tablet Take by mouth. 07/15/21  Yes [provider]  ?ondansetron (ZOFRAN) 4 MG tablet Take 4 mg by mouth every 8 (eight) hours as needed. 09/17/21  Yes [provider]  ?pantoprazole (PROTONIX) 40 MG tablet Take 1 tablet (40 mg total) by mouth 2 (two) times daily before a meal. 30 minutes  before meal 04/27/19  Yes McLean-Scocuzza, Nino Glow, MD  ?telmisartan (MICARDIS) 40 MG tablet Take 0.5 tablets (20 mg total) by mouth daily. Take 1/2 tab po daily ?Patient taking differently: Take 80 mg by mouth daily. 03/21/19  Yes McLean-Scocuzza, Nino Glow, MD  ?traZODone (DESYREL) 50 MG tablet Take 1 tablet by mouth at bedtime. 04/24/21 04/24/22 Yes [provider]  ? ? ?Allergies as of 10/03/2021 - Review Complete 10/02/2021  ?Allergen Reaction Noted  ? Chantix [varenicline tartrate]  03/15/2019  ? Atorvastatin Other (See Comments) 09/26/2019  ? Penicillins Rash 09/16/2017  ? Wellbutrin [bupropion]  03/15/2019  ? ? ?Family History  ?Problem Relation  Age of Onset  ? Hypertension Mother   ? Dementia Mother   ? Arthritis Mother   ? Hyperlipidemia Mother   ? Cancer Father   ?     colon   ? Early death Father   ? Learning disabilities Brother   ? Colon polyps Brother   ? Hyperlipidemia Brother   ? Hypertension Brother   ? Heart attack Maternal Grandmother   ? Arthritis Maternal Grandmother   ? Heart disease Maternal Grandmother   ? Colon polyps Sister   ? Lupus Sister   ? Heart disease Maternal Grandfather   ? Heart disease Paternal Grandmother   ? Cancer Other   ? ? ?Social History  ? ?Socioeconomic History  ? Marital status: Single  ?  Spouse name: Not on file  ? Number of children: Not on file  ? Years of education: Not on file  ? Highest education level: Not on file  ?Occupational History  ? 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 Smoking status: Every Day  ?  Packs/day: 1.00  ?  Types: Cigarettes  ? Smokeless tobacco: Never  ?Vaping Use  ? Vaping Use: Former  ?Substance and Sexual Activity  ? Alcohol use: No  ?  Comment: rare  ? Drug use: No  ? Sexual activity: Yes  ?Other Topics Concern  ? Not on file  ?Social History Narrative  ? 1 son and 1 grandchild in [place] in [place]   ? 1 fiance of 14 years as of 03/15/2019 (with h/o alcoholism, cardiac defibrillator, liver failure)   ? Was CNA  ? ?Social Determinants of Health  ? ?Financial Resource Strain: Not on file  ?Food Insecurity: Not on file  ?Transportation Needs: Not on file  ?Physical Activity: Not on file  ?Stress: Not on file  ?Social Connections: Not on file  ?Intimate Partner Violence: Not on file  ? ? ?Review of Systems: ?See HPI, otherwise negative ROS ? ?Physical Exam: ?BP (!) 145/64   Pulse 67   Temp (!) 97.5 ?F (36.4 ?C)   Resp 18   Ht 5\' 4"  (1.626 m)   Wt 62.1 kg   SpO2 98%   BMI 23.52 kg/m?  ?General:   Alert,  pleasant and cooperative in NAD ?Head:  Normocephalic and atraumatic. ?Lungs:  Clear to auscultation.    ?Heart:  Regular rate and rhythm.   ? ?Impression/Plan: ?Nicole Herman is here for ophthalmic surgery. ? ?Risks, benefits, limitations, and alternatives regarding ophthalmic surgery have been reviewed with the patient.  Questions have been answered.  All parties agreeable. ? ? Leandrew Koyanagi, MD  11/12/2021, 2:08 PM  ? ?

## 2021-11-12 NOTE — Transfer of Care (Signed)
Immediate Anesthesia Transfer of Care Note ? ?Patient: Nicole Herman ? ?Procedure(s) Performed: CATARACT EXTRACTION PHACO AND INTRAOCULAR LENS PLACEMENT (IOC)  RIGHT (Right: Eye) ? ?Patient Location: PACU ? ?Anesthesia Type: MAC ? ?Level of Consciousness: awake, alert  and patient cooperative ? ?Airway and Oxygen Therapy: Patient Spontanous Breathing and Patient connected to supplemental oxygen ? ?Post-op Assessment: Post-op Vital signs reviewed, Patient's Cardiovascular Status Stable, Respiratory Function Stable, Patent Airway and No signs of Nausea or vomiting ? ?Post-op Vital Signs: Reviewed and stable ? ?Complications: No notable events documented. ? ?

## 2021-11-12 NOTE — Op Note (Signed)
LOCATION:  [facility]   PREOPERATIVE DIAGNOSIS:    Nuclear sclerotic cataract right eye. H25.11   POSTOPERATIVE DIAGNOSIS:  Nuclear sclerotic cataract right eye.     PROCEDURE:  Phacoemusification with posterior chamber intraocular lens placement of the right eye   ULTRASOUND TIME: Procedure(s) with comments: CATARACT EXTRACTION PHACO AND INTRAOCULAR LENS PLACEMENT (IOC)  RIGHT (Right) - CAN'T ARRIVE UNTIL 1 6.26 00:50.2  LENS:   Implant Name Type Inv. Item Serial No. Manufacturer Lot No. LRB No. Used Action  LENS IOL TECNIS EYHANCE 21.5 - [id number] Intraocular Lens LENS IOL TECNIS EYHANCE 21.5 [id number] [employer]  Right 1 Implanted         SURGEON:  Merleen Nicely, MD   ANESTHESIA:  Topical with tetracaine drops and 2% Xylocaine jelly, augmented with 1% preservative-free intracameral lidocaine.    COMPLICATIONS:  None.   DESCRIPTION OF PROCEDURE:  The patient was identified in the holding room and transported to the operating room and placed in the supine position under the operating microscope.  The right eye was identified as the operative eye and it was prepped and draped in the usual sterile ophthalmic fashion.   A 1 millimeter clear-corneal paracentesis was made at the superotemporal position.  0.5 ml of preservative-free 1% lidocaine was injected into the anterior chamber. The anterior chamber was filled with Viscoat viscoelastic.  A 2.4 millimeter keratome was used to make a near-clear corneal incision at the inferotemporal position.  A curvilinear capsulorrhexis was made with a cystotome and capsulorrhexis forceps.  Balanced salt solution was used to hydrodissect and hydrodelineate the nucleus.   Phacoemulsification was then used in tilt and tumble fashion to remove the lens nucleus and epinucleus.  The remaining cortex was then removed using the irrigation and aspiration handpiece. Provisc was then placed into the capsular bag to distend it for lens placement.  A  lens was then injected into the capsular bag.  The remaining viscoelastic was aspirated.   Wounds were hydrated with balanced salt solution.  The anterior chamber was inflated to a physiologic pressure with balanced salt solution.  No wound leaks were noted. Intracameral Vigamox was injected into the anterior chamber.  Timolol and Brimonidine drops were applied to the eye.  The patient was taken to the recovery room in stable condition without complications of anesthesia or surgery.   Keyvon Herter 11/12/2021, 2:47 PM

## 2021-11-14 ENCOUNTER — Encounter: Payer: Self-pay | Admitting: Ophthalmology

## 2021-12-27 ENCOUNTER — Other Ambulatory Visit: Payer: Self-pay | Admitting: Gastroenterology

## 2022-02-04 DIAGNOSIS — M47816 Spondylosis without myelopathy or radiculopathy, lumbar region: Secondary | ICD-10-CM | POA: Insufficient documentation

## 2022-02-05 ENCOUNTER — Other Ambulatory Visit: Payer: Self-pay | Admitting: Surgery

## 2022-02-05 DIAGNOSIS — M5416 Radiculopathy, lumbar region: Secondary | ICD-10-CM

## 2022-02-05 DIAGNOSIS — M47816 Spondylosis without myelopathy or radiculopathy, lumbar region: Secondary | ICD-10-CM

## 2022-02-07 ENCOUNTER — Ambulatory Visit
Admission: RE | Admit: 2022-02-07 | Discharge: 2022-02-07 | Disposition: A | Discharge status: Home / Self Care | Payer: Medicare Other | Source: Ambulatory Visit | Attending: Surgery | Admitting: Surgery

## 2022-02-07 DIAGNOSIS — M47816 Spondylosis without myelopathy or radiculopathy, lumbar region: Secondary | ICD-10-CM | POA: Insufficient documentation

## 2022-02-07 DIAGNOSIS — M5416 Radiculopathy, lumbar region: Secondary | ICD-10-CM | POA: Diagnosis present

## 2022-03-02 ENCOUNTER — Other Ambulatory Visit: Payer: Self-pay | Admitting: Gastroenterology

## 2022-04-20 ENCOUNTER — Ambulatory Visit (INDEPENDENT_AMBULATORY_CARE_PROVIDER_SITE_OTHER): Payer: Medicare Other | Admitting: Vascular Surgery

## 2022-04-20 ENCOUNTER — Ambulatory Visit (INDEPENDENT_AMBULATORY_CARE_PROVIDER_SITE_OTHER): Payer: Medicare Other

## 2022-04-20 ENCOUNTER — Encounter (INDEPENDENT_AMBULATORY_CARE_PROVIDER_SITE_OTHER): Payer: Self-pay | Admitting: Vascular Surgery

## 2022-04-20 VITALS — BP 161/86 | HR 57 | Resp 16 | Wt 142.6 lb

## 2022-04-20 DIAGNOSIS — E782 Mixed hyperlipidemia: Secondary | ICD-10-CM

## 2022-04-20 DIAGNOSIS — I6523 Occlusion and stenosis of bilateral carotid arteries: Secondary | ICD-10-CM | POA: Diagnosis not present

## 2022-04-20 DIAGNOSIS — J449 Chronic obstructive pulmonary disease, unspecified: Secondary | ICD-10-CM | POA: Diagnosis not present

## 2022-04-20 DIAGNOSIS — I10 Essential (primary) hypertension: Secondary | ICD-10-CM | POA: Diagnosis not present

## 2022-04-20 NOTE — Progress Notes (Signed)
MRN : [id number]  Nicole Herman is a 66 y.o. (April 04, 1956) female who presents with chief complaint of check carotid arteries.  History of Present Illness:   The patient is status post a left CEA on Jan 25, 2011 and placement of a 9 mm x 7 mm x 30 mm exact stent with the use of the NAV-6 embolic protection device in the right carotid artery.     There were no post operative problems or complications related to the surgery.  The patient denies neck or incisional pain.   The patient denies interval amaurosis fugax. There is no recent history of TIA symptoms or focal motor deficits. There is no prior documented CVA.   The patient denies headache.   The patient is taking enteric-coated aspirin 81 mg daily.   The patient has a history of coronary artery disease, no recent episodes of angina or shortness of breath. The patient denies PAD or claudication symptoms. There is a history of hyperlipidemia which is being treated with a statin.    Carotid duplex today shows bilateral <40% ICA stenosis.  No change bilaterally compared to the last study    No outpatient medications have been marked as taking for the 04/20/22 encounter (Appointment) with Delana Meyer, Dolores Lory, MD.    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   Family history of adverse reaction to anesthesia    sister - PONV   Fibromyalgia    GERD (gastroesophageal reflux disease)    Granuloma annulare    Heart murmur    born with it, no treatment   History of chicken pox    Hypertension    Mitral valve regurgitation     PONV (postoperative nausea and vomiting)    Thyroid disease     Past Surgical History:  Procedure Laterality Date   ABDOMINAL HYSTERECTOMY     1985   CAROTID ENDARTERECTOMY     left    CAROTID PTA/STENT INTERVENTION Right 03/22/2019   Procedure: CAROTID PTA/STENT INTERVENTION;  Surgeon: Katha Cabal, MD;  Location: [facility];  Service: Cardiovascular;  Laterality: Right;   CARPAL TUNNEL RELEASE     x2   CATARACT EXTRACTION W/PHACO Left 10/29/2021   Procedure: CATARACT EXTRACTION PHACO AND INTRAOCULAR LENS PLACEMENT ([place]) LEFT;  Surgeon: Leandrew Koyanagi, MD;  Location: [facility] [place];  Service: Ophthalmology;  Laterality: Left;  2.25 00:18.7   CATARACT EXTRACTION W/PHACO Right 11/12/2021   Procedure: CATARACT EXTRACTION PHACO AND INTRAOCULAR LENS PLACEMENT ([place])  RIGHT;  Surgeon: Leandrew Koyanagi, MD;  Location: [facility];  Service: Ophthalmology;  Laterality: Right;  CAN'T ARRIVE UNTIL 1 6.26 00:50.2   CESAREAN SECTION     1982   COLONOSCOPY WITH PROPOFOL N/A 03/06/2020   Procedure: COLONOSCOPY WITH PROPOFOL;  Surgeon: Lin Landsman, MD;  Location: [facility]COPY;  Service: Gastroenterology;  Laterality: N/A;   ESOPHAGOGASTRODUODENOSCOPY (EGD) WITH PROPOFOL N/A 03/06/2020   Procedure: ESOPHAGOGASTRODUODENOSCOPY (  EGD) WITH PROPOFOL;  Surgeon: Lin Landsman, MD;  Location: [facility] ENDOSCOPY;  Service: Gastroenterology;  Laterality: N/A;   OOPHORECTOMY     1/2 ovaries removed    STAPEDES SURGERY     otosclerosis right .  prostetic in right.   TRIGGER FINGER RELEASE      Social History Social History   Tobacco Use   Smoking status: Every Day    Packs/day: 1.00    Types: Cigarettes   Smokeless tobacco: Never  Vaping Use   Vaping Use: Former  Substance Use Topics   Alcohol use: No    Comment: rare   Drug use: No    Family History Family History  Problem Relation Age of Onset   Hypertension Mother    Dementia Mother     Arthritis Mother    Hyperlipidemia Mother    Cancer Father        colon    Early death Father    Learning disabilities Brother    Colon polyps Brother    Hyperlipidemia Brother    Hypertension Brother    Heart attack Maternal Grandmother    Arthritis Maternal Grandmother    Heart disease Maternal Grandmother    Colon polyps Sister    Lupus Sister    Heart disease Maternal Grandfather    Heart disease Paternal Grandmother    Cancer Other     Allergies  Allergen Reactions   Chantix [Varenicline Tartrate]     Nightmares     Atorvastatin Other (See Comments)    Body aches--on higher doses   Hydrochlorothiazide     Lightheadedness   Penicillins Rash    Did it involve swelling of the face/tongue/throat, SOB, or low BP? No Did it involve sudden or severe rash/hives, skin peeling, or any reaction on the inside of your mouth or nose? Yes Did you need to seek medical attention at a hospital or doctor's office? Yes When did it last happen? 3-4 years ago If all above answers are "NO", may proceed with cephalosporin use.    Wellbutrin [Bupropion]     Did not like the way it made her feel: "too wired."      REVIEW OF SYSTEMS (Negative unless checked)  Constitutional: '[]'$ Weight loss  '[]'$ Fever  '[]'$ Chills Cardiac: '[]'$ Chest pain   '[]'$ Chest pressure   '[]'$ Palpitations   '[]'$ Shortness of breath when laying flat   '[]'$ Shortness of breath with exertion. Vascular:  '[x]'$ Pain in legs with walking   '[]'$ Pain in legs at rest  '[]'$ History of DVT   '[]'$ Phlebitis   '[]'$ Swelling in legs   '[]'$ Varicose veins   '[]'$ Non-healing ulcers Pulmonary:   '[]'$ Uses home oxygen   '[]'$ Productive cough   '[]'$ Hemoptysis   '[]'$ Wheeze  '[]'$ COPD   '[]'$ Asthma Neurologic:  '[]'$ Dizziness   '[]'$ Seizures   '[]'$ History of stroke   '[]'$ History of TIA  '[]'$ Aphasia   '[]'$ Vissual changes   '[]'$ Weakness or numbness in arm   '[]'$ Weakness or numbness in leg Musculoskeletal:   '[]'$ Joint swelling   '[]'$ Joint pain   '[]'$ Low back pain Hematologic:  '[]'$ Easy bruising  '[]'$ Easy bleeding    '[]'$ Hypercoagulable state   '[]'$ Anemic Gastrointestinal:  '[]'$ Diarrhea   '[]'$ Vomiting  '[]'$ Gastroesophageal reflux/heartburn   '[]'$ Difficulty swallowing. Genitourinary:  '[]'$ Chronic kidney disease   '[]'$ Difficult urination  '[]'$ Frequent urination   '[]'$ Blood in urine Skin:  '[]'$ Rashes   '[]'$ Ulcers  Psychological:  '[]'$ History of anxiety   '[]'$  History of major depression.  Physical Examination  There were no vitals filed for this visit. There is no height or weight  on file to calculate BMI. Gen: WD/WN, NAD Head: [place]/AT, No temporalis wasting.  Ear/Nose/Throat: Hearing grossly intact, nares w/o erythema or drainage Eyes: PER, EOMI, sclera nonicteric.  Neck: Supple, no masses.  No bruit or JVD.  Pulmonary:  Good air movement, no audible wheezing, no use of accessory muscles.  Cardiac: RRR, normal S1, S2, no Murmurs. Vascular:  No carotid bruit noted; well healed left CEA scar Vessel Right Left  Radial Palpable Palpable  Carotid  Palpable  Palpable  Gastrointestinal: soft, non-distended. No guarding/no peritoneal signs.  Musculoskeletal: M/S 5/5 throughout.  No visible deformity.  Neurologic: CN 2-12 intact. Pain and light touch intact in extremities.  Symmetrical.  Speech is fluent. Motor exam as listed above. Psychiatric: Judgment intact, Mood & affect appropriate for pt's clinical situation. Dermatologic: No rashes or ulcers noted.  No changes consistent with cellulitis.   CBC Lab Results  Component Value Date   WBC 7.4 10/02/2021   HGB 9.9 (L) 10/02/2021   HCT 31.2 (L) 10/02/2021   MCV 78.8 (L) 10/02/2021   PLT 258 10/02/2021    BMET    Component Value Date/Time   NA 136 10/02/2021 1924   NA 139 09/27/2018 0922   NA 139 12/14/2011 0731   K 3.6 10/02/2021 1924   K 4.5 12/14/2011 0731   CL 104 10/02/2021 1924   CL 103 12/14/2011 0731   CO2 23 10/02/2021 1924   CO2 30 12/14/2011 0731   GLUCOSE 110 (H) 10/02/2021 1924   GLUCOSE 126 (H) 12/14/2011 0731   BUN 10 10/02/2021 1924   BUN 15 09/27/2018  0922   BUN 14 12/14/2011 0731   CREATININE 1.01 (H) 10/02/2021 1924   CREATININE 1.03 12/14/2011 0731   CALCIUM 9.2 10/02/2021 1924   CALCIUM 9.1 12/14/2011 0731   GFRNONAA >60 10/02/2021 1924   GFRNONAA 59 (L) 12/14/2011 0731   GFRAA >60 07/28/2019 1146   GFRAA >60 12/14/2011 0731   CrCl cannot be calculated (Patient's most recent lab result is older than the maximum 21 days allowed.).  COAG No results found for: "INR", "PROTIME"  Radiology No results found.   Assessment/Plan 1. Carotid stenosis, bilateral Recommend:   Given the patient's asymptomatic subcritical stenosis no further invasive testing or surgery at this time.   Carotid Duplex done today shows velocities consistent with <40% stenosis in the left ICA and the the right ICA.  Previously placed stent in the right carotid artery is widely patent.   Continue antiplatelet therapy as prescribed Continue management of CAD, HTN and Hyperlipidemia Healthy heart diet,  encouraged exercise at least 4 times per week Follow up in 12 months with duplex ultrasound and physical exam.  - VAS US CAROTID; Future  2. Essential hypertension Continue antihypertensive medications as already ordered, these medications have been reviewed and there are no changes at this time.   3. Chronic obstructive pulmonary disease, unspecified COPD type ([place]) Continue pulmonary medications and aerosols as already ordered, these medications have been reviewed and there are no changes at this time.    4. Mixed hyperlipidemia Continue statin as ordered and reviewed, no changes at this time     Hortencia Pilar, MD  04/20/2022 9:23 AM

## 2022-04-21 ENCOUNTER — Ambulatory Visit: Payer: Medicare Other | Admitting: Neurosurgery

## 2022-04-21 ENCOUNTER — Encounter: Payer: Self-pay | Admitting: Neurosurgery

## 2022-04-21 VITALS — BP 136/86 | Ht 64.0 in | Wt 142.8 lb

## 2022-04-21 DIAGNOSIS — M5416 Radiculopathy, lumbar region: Secondary | ICD-10-CM

## 2022-04-21 NOTE — Progress Notes (Signed)
Referring Physician:  Idelle Crouch, MD [facility] [place],  [place] [address]  Primary Physician:  Idelle Crouch, MD  History of Present Illness: 04/21/2022 [name] is here today for follow-up from her lumbar radiculopathy.  She was doing much better until last Friday when she stretched and had some return of symptoms.  Overall, she is still much better than she was when I last saw her.  Has done physical therapy as well as had an epidural injection on June 15 which helped dramatically.  02/20/2022 [name] is here today with a chief complaint of right buttock pain with radiation to the right lower extremity with some numbness and tingling in the right posterior calf to her first and second toes.  She has been having pain for 2 to 3 months. Her pain is burning and stabbing and as bad as 10 out of 10. She has started physical therapy which is improved her pain to some degree. Standing and walking make it worse. Carrying items makes it worse. Sitting and laying down makes it better. Her pain is worst at night. Bowel/Bladder Dysfunction: none  Conservative measures:  Physical therapy: has participated in at [facility]  Multimodal medical therapy including regular antiinflammatories: tylenol, ibuprofen, prednisone, tizanidine, tramadol, celebrex Injections: has not received epidural steroid injections but is scheduled for one on 02/26/22.  Past Surgery: denies  Nicole Herman has no symptoms of cervical myelopathy.  The symptoms are causing a significant impact on the patient's life.    Review of Systems:  A 10 point review of systems is negative, except for the pertinent positives and negatives detailed in the HPI.  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   Family history of adverse reaction to anesthesia    sister - PONV   Fibromyalgia    GERD (gastroesophageal reflux disease)    Granuloma annulare    Heart murmur    born with it, no treatment   History of chicken pox    Hypertension    Mitral valve regurgitation    PONV (postoperative nausea and vomiting)    Thyroid disease     Past Surgical History: Past Surgical History:  Procedure Laterality Date   ABDOMINAL HYSTERECTOMY     1985   CAROTID ENDARTERECTOMY     left    CAROTID PTA/STENT INTERVENTION Right 03/22/2019   Procedure: CAROTID PTA/STENT INTERVENTION;  Surgeon: Katha Cabal, MD;  Location: [place] [facility];  Service: Cardiovascular;  Laterality: Right;   CARPAL TUNNEL RELEASE     x2   CATARACT EXTRACTION W/PHACO Left 10/29/2021   Procedure: CATARACT EXTRACTION PHACO AND INTRAOCULAR LENS PLACEMENT ([place]) LEFT;  Surgeon: Leandrew Koyanagi, MD;  Location: [place];  Service: Ophthalmology;  Laterality: Left;  2.25 00:18.7   CATARACT EXTRACTION W/PHACO Right 11/12/2021   Procedure: CATARACT EXTRACTION PHACO AND INTRAOCULAR LENS PLACEMENT (IOC)  RIGHT;  Surgeon: Leandrew Koyanagi, MD;  Location: [place];  Service: Ophthalmology;  Laterality: Right;  CAN'T ARRIVE UNTIL 1 6.26 00:50.2   CESAREAN SECTION     1982   COLONOSCOPY WITH PROPOFOL N/A 03/06/2020   Procedure: COLONOSCOPY WITH PROPOFOL;  Surgeon: Lin Landsman, MD;  Location: [facility] ENDOSCOPY;  Service: Gastroenterology;  Laterality: N/A;   ESOPHAGOGASTRODUODENOSCOPY (EGD) WITH PROPOFOL N/A 03/06/2020   Procedure: ESOPHAGOGASTRODUODENOSCOPY (EGD) WITH PROPOFOL;  Surgeon: Lin Landsman, MD;  Location[facility] ENDOSCOPY;  Service:  Gastroenterology;  Laterality: N/A;   OOPHORECTOMY     1/2 ovaries removed    STAPEDES SURGERY     otosclerosis right .  prostetic in right.   TRIGGER FINGER RELEASE      Allergies: Allergies as of 04/21/2022 - Review Complete 04/21/2022  Allergen Reaction Noted   Chantix [varenicline tartrate]  03/15/2019   Atorvastatin Other (See Comments) 09/26/2019   Hydrochlorothiazide  10/15/2021   Penicillins Rash 09/16/2017   Wellbutrin [bupropion]  03/15/2019    Medications: Current Meds  Medication Sig   aspirin 81 MG chewable tablet Chew 81 mg by mouth daily.    atorvastatin (LIPITOR) 10 MG tablet Take 10 mg by mouth daily.   clopidogrel (PLAVIX) 75 MG tablet Take 1 tablet (75 mg total) by mouth daily.   Iron-FA-B Cmp-C-Biot-Probiotic (FUSION PLUS) CAPS TAKE 1 CAPSULE BY MOUTH ONCE DAILY   levothyroxine (SYNTHROID) 75 MCG tablet Take by mouth.   Omega-3 Fatty Acids (OMEGA-3 FISH OIL PO) Take 1 tablet by mouth daily.   ondansetron (ZOFRAN) 4 MG tablet Take 4 mg by mouth every 8 (eight) hours as needed.   pantoprazole (PROTONIX) 40 MG tablet Take 1 tablet (40 mg total) by mouth 2 (two) times daily before a meal. 30 minutes before meal   telmisartan (MICARDIS) 80 MG tablet Take 80 mg by mouth daily.    Social History: Social History   Tobacco Use   Smoking status: Every Day    Packs/day: 1.00    Types: Cigarettes   Smokeless tobacco: Never  Vaping Use   Vaping Use: Former  Substance Use Topics   Alcohol use: No    Comment: rare   Drug use: No    Family Medical History: Family History  Problem Relation Age of Onset   Hypertension Mother    Dementia Mother    Arthritis Mother    Hyperlipidemia Mother    Cancer Father        colon    Early death Father    Learning disabilities Brother    Colon polyps Brother    Hyperlipidemia Brother    Hypertension Brother    Heart attack Maternal Grandmother    Arthritis Maternal Grandmother    Heart disease Maternal Grandmother     Colon polyps Sister    Lupus Sister    Heart disease Maternal Grandfather    Heart disease Paternal Grandmother    Cancer Other     Physical Examination: Vitals:   04/21/22 1128  BP: 136/86    General: Patient is well developed, well nourished, calm, collected, and in no apparent distress. Attention to examination is appropriate.  Neck:   Supple.  Full range of motion.  Respiratory: Patient is breathing without any difficulty.   NEUROLOGICAL:     Awake, alert, oriented to person, place, and time.  Speech is clear and fluent. Fund of knowledge is appropriate.   Cranial Nerves: Pupils equal round and reactive to light.  Facial tone is  symmetric.  Facial sensation is symmetric. Shoulder shrug is symmetric. Tongue protrusion is midline.  There is no pronator drift.  ROM of spine: full.    Strength: Side Biceps Triceps Deltoid Interossei Grip Wrist Ext. Wrist Flex.  R '5 5 5 5 5 5 5  '$ L '5 5 5 5 5 5 5   '$ Side Iliopsoas Quads Hamstring PF DF EHL  R '5 5 5 5 5 5  '$ L '5 5 5 5 5 5   '$ Reflexes are12+ and symmetric at the biceps, triceps, brachioradialis, patella and achilles.   Hoffman's is absent.  Clonus is not present.  Toes are down-going.  Bilateral upper and lower extremity sensation is intact to light touch.    No evidence of dysmetria noted.  Gait is normal.    Medical Decision Making  Imaging: MRI L spine 02/07/22 IMPRESSION:  1. Dominant findings at L4-5 where there is a large right  paracentral extrusion with inferior migration compressing the right  L5 nerve root. Background degeneration this level causes high-grade  spinal stenosis.  2. Degenerative spinal stenosis that is moderate at L3-4 and  mild-to-moderate at L2-3.   Electronically Signed  By: Jorje Guild M.D.  On: 02/09/2022 08:36   I have personally reviewed the images and agree with the above interpretation.  Assessment and Plan: Nicole Herman is a pleasant 66 y.o. female with lumbar radiculopathy  which is significantly improved since last time I saw her.  She would like to continue physical therapy and have another injection.  I think this is appropriate.  I will make the appropriate for referrals.  If her symptoms continue to improve, she will not need surgical intervention.  She will let me know if her symptoms return and she would like to consider discectomy.   I spent a total of 15 minutes in face-to-face and non-face-to-face activities related to this patient's care today.  Thank you for involving me in the care of this patient.      Leeya Rusconi K. Izora Ribas MD, [facility] Neurosurgery

## 2022-05-12 ENCOUNTER — Ambulatory Visit: Payer: Medicare Other | Admitting: Neurosurgery

## 2022-05-20 ENCOUNTER — Other Ambulatory Visit: Payer: Self-pay | Admitting: Internal Medicine

## 2022-05-20 DIAGNOSIS — Z1231 Encounter for screening mammogram for malignant neoplasm of breast: Secondary | ICD-10-CM

## 2022-06-12 ENCOUNTER — Other Ambulatory Visit: Payer: Self-pay

## 2022-06-15 ENCOUNTER — Ambulatory Visit (INDEPENDENT_AMBULATORY_CARE_PROVIDER_SITE_OTHER): Payer: Medicare Other | Admitting: Gastroenterology

## 2022-06-15 ENCOUNTER — Encounter: Payer: Self-pay | Admitting: Gastroenterology

## 2022-06-15 VITALS — BP 172/90 | HR 58 | Temp 98.1°F | Ht 64.0 in | Wt 144.5 lb

## 2022-06-15 DIAGNOSIS — R140 Abdominal distension (gaseous): Secondary | ICD-10-CM | POA: Diagnosis not present

## 2022-06-15 DIAGNOSIS — R1010 Upper abdominal pain, unspecified: Secondary | ICD-10-CM | POA: Diagnosis not present

## 2022-06-15 NOTE — Progress Notes (Signed)
Nicole Darby, MD [address]  [place]  [place], [place] [address]  Main: [phone]  Fax: [phone]    Gastroenterology Consultation  Referring Provider:     Idelle Crouch, MD Primary Care Physician:  Nicole Crouch, MD Primary Gastroenterologist:  Dr. [facility] Reason for Consultation:    Chronic nausea, abdominal bloating, intermittent loose stools, early satiety       HPI:   [facility] is a 66 y.o. female referred by [facility], Nicole Douglas, MD  for consultation & management of reflux.  Patient reports she has been experiencing symptoms of regurgitation, burning in her chest, intermittent, sporadic left upper quadrant pain radiating to lower back.  She reports that her reflux symptoms have been ongoing for several years, has been maintained on Protonix 40 mg twice daily which keeps her symptoms under control.  She is followed by ENT, [facility] for airway polyps.  She tried to switch to a different proton pump inhibitor, was not effective.  She also underwent carotid artery stent placement last month and has been on Plavix since then.  She is currently dealing with intermittent episodes of lightheadedness, dizziness with intermittent bradycardia for which she is seeing [facility].  She is an active smoker.  She denies black stools, blood in the stools, epigastric pain, nausea or vomiting.  Her last hemoglobin was 10.1 about a month ago, normal MCV.  She does not have chronic liver disease. She denies weight loss.  She does have history of colon polyps and underwent colonoscopy several years ago.  Follow-up visit 02/13/2020 Patient underwent removal of vocal cord polyps.  She did not follow-up regarding upper endoscopy and colonoscopy for iron deficiency anemia.  She is here today to discuss about procedures.  She is currently on over-the-counter iron 65 mg 2 times daily resulting in dark stools.  Her most recent hemoglobin was 9.7 on 12/27/2019, MCV 78.1,  ferritin 5, normal TSH.  She does report heartburn for which she is taking Protonix 2 times a day before meals.   Follow-up visit 09/25/2021 Patient is here to discuss about nausea that has started several months ago.  Her nausea is associated with headache and she has significant craving for ice.  She does have history of iron deficiency anemia, currently not on any oral iron replacement therapy.  Patient did not undergo video capsule endoscopy as recommended.  She did not come for a follow-up appointment for more than a year.  Patient states that she went through significant amount of stress and anxiety when she found her boyfriend committed suicide and was found in her backyard.  She was dealing with managing his house.  She also had to go to [place] which was quite stressful.  She used to experience left upper quadrant discomfort, which has resolved.  She is currently taking Protonix 40 mg once a day, started by [facility].  She is also taking Zofran as needed for nausea which helps.  Patient denies any particular relation of food to nausea, denies early morning episodes.  Patient denies heartburn, regurgitation, weight loss.  Her weight has been stable since my last visit with her in 02/2020.  Patient denies any problems with her bowel movements.  Her labs from December revealed mild normocytic anemia only.  Patient had COVID infection, tested positive on 08/27/2021.  Follow-up visit 06/15/22 Patient was recently treated for E. coli UTI.  She also had back issues that required prednisone shots.  She is here to discuss about ongoing intermittent nausea, left upper quadrant discomfort which is postprandial, early satiety and intermittent loose stools associated with abdominal bloating.  Patient continues to smoke 1 pack/day.  Patient is no longer anemic, she is still taking fusion plus daily.  Her iron deficiency anemia has resolved  NSAIDs: None  Antiplts/Anticoagulants/Anti thrombotics: None  GI  Procedures: Colonoscopy more than 10 years ago, found to have colon polyps EGD and colonoscopy 03/06/2020 for iron deficiency anemia  - Normal examined duodenum. Biopsied. - Normal stomach. Biopsied. - Esophagogastric landmarks identified. - Salmon-colored mucosa suspicious for short-segment Barrett's esophagus. Biopsied.  - The examined portion of the ileum was normal. - Four 3 to 5 mm polyps at the recto-sigmoid colon, in the descending colon and in the cecum, removed with a cold snare. Resected and retrieved. - Two diminutive polyps in the ascending colon, removed with a cold biopsy forceps. Resected and retrieved. - The distal rectum and anal verge are normal on retroflexion view.  DIAGNOSIS:  A. DUODENUM; COLD BIOPSY:  - ENTERIC MUCOSA WITH PRESERVED VILLOUS ARCHITECTURE AND NO SIGNIFICANT  HISTOPATHOLOGIC CHANGE.  - NEGATIVE FOR FEATURES OF CELIAC, DYSPLASIA, AND MALIGNANCY.   B. STOMACH, RANDOM; COLD BIOPSY:  - GASTRIC ANTRAL MUCOSA WITH FEATURES OF MILD REACTIVE GASTROPATHY.  - GASTRIC OXYNTIC MUCOSA WITH FEATURES SUGGESTIVE OF PPI EFFECT.  - NEGATIVE FOR H. PYLORI, DYSPLASIA, AND MALIGNANCY.   C. GASTROESOPHAGEAL JUNCTION; COLD BIOPSY:  - BENIGN SQUAMOUS MUCOSA WITH MILD ACANTHOSIS.  - NO COLUMNAR MUCOSA PRESENT TO EVALUATE FOR INTESTINAL METAPLASIA.  - NO INCREASE IN INTRAEPITHELIAL EOSINOPHILS (LESS THAN 2 PER HPF).  - NEGATIVE FOR DYSPLASIA AND MALIGNANCY.   D. COLON POLYP, CECUM; COLD SNARE:  - TUBULAR ADENOMA.  - NEGATIVE FOR HIGH-GRADE DYSPLASIA AND MALIGNANCY.   E. COLON POLYPS X2, ASCENDING; COLD BIOPSY:  - FRAGMENTS (X2) OF BENIGN COLONIC MUCOSA WITH SUPERFICIAL REACTIVE  CHANGES.  - NEGATIVE FOR DYSPLASIA OR MALIGNANCY.   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   Family history of adverse reaction to anesthesia    sister - PONV   Fibromyalgia    GERD (gastroesophageal reflux disease)    Granuloma annulare    Heart murmur    born with it, no treatment   History of chicken pox    Hypertension    Mitral valve regurgitation    PONV (postoperative nausea and vomiting)    Thyroid disease     Past Surgical History:  Procedure Laterality Date   ABDOMINAL HYSTERECTOMY     1985   CAROTID ENDARTERECTOMY     left    CAROTID PTA/STENT INTERVENTION Right 03/22/2019   Procedure: CAROTID PTA/STENT INTERVENTION;  Surgeon: Katha Cabal, MD;  Location: [facility];  Service: Cardiovascular;  Laterality: Right;   CARPAL TUNNEL RELEASE     x2   CATARACT EXTRACTION W/PHACO Left 10/29/2021   Procedure: CATARACT EXTRACTION PHACO AND INTRAOCULAR LENS PLACEMENT ([facility])  LEFT;  Surgeon: Leandrew Koyanagi, MD;  Location: [facility];  Service: Ophthalmology;  Laterality: Left;  2.25 00:18.7   CATARACT EXTRACTION W/PHACO Right 11/12/2021   Procedure: CATARACT EXTRACTION PHACO AND INTRAOCULAR LENS PLACEMENT ([place])  RIGHT;  Surgeon: Leandrew Koyanagi, MD;  Location: [facility];  Service: Ophthalmology;  Laterality: Right;  CAN'T ARRIVE UNTIL 1 6.26 00:50.2   CESAREAN SECTION     1982   COLONOSCOPY WITH PROPOFOL N/A 03/06/2020   Procedure: COLONOSCOPY WITH PROPOFOL;  Surgeon: Lin Landsman, MD;  Location: [facility] ENDOSCOPY;  Service: Gastroenterology;  Laterality: N/A;   ESOPHAGOGASTRODUODENOSCOPY (EGD) WITH PROPOFOL N/A 03/06/2020   Procedure: ESOPHAGOGASTRODUODENOSCOPY (EGD) WITH PROPOFOL;  Surgeon: Lin Landsman, MD;  [facility];  Service: Gastroenterology;  Laterality: N/A;   OOPHORECTOMY     1/2  ovaries removed    STAPEDES SURGERY     otosclerosis right .  prostetic in right.   TRIGGER FINGER RELEASE      Current Outpatient Medications:    aspirin 81 MG chewable tablet, Chew 81 mg by mouth daily. , Disp: , Rfl:    atorvastatin (LIPITOR) 10 MG tablet, Take 10 mg by mouth daily., Disp: , Rfl:    clopidogrel (PLAVIX) 75 MG tablet, Take 1 tablet (75 mg total) by mouth daily., Disp: 90 tablet, Rfl: 3   gabapentin (NEURONTIN) 100 MG capsule, Take 1 capsule by mouth at bedtime., Disp: , Rfl:    Iron-FA-B Cmp-C-Biot-Probiotic (FUSION PLUS) CAPS, TAKE 1 CAPSULE BY MOUTH ONCE DAILY, Disp: 30 capsule, Rfl: 2   levothyroxine (SYNTHROID) 75 MCG tablet, Take by mouth., Disp: , Rfl:    ondansetron (ZOFRAN) 4 MG tablet, Take 4 mg by mouth every 8 (eight) hours as needed., Disp: , Rfl:    pantoprazole (PROTONIX) 40 MG tablet, Take 1 tablet (40 mg total) by mouth 2 (two) times daily before a meal. 30 minutes before meal, Disp: 180 tablet, Rfl: 1   telmisartan (MICARDIS) 80 MG tablet, Take 80 mg by mouth daily., Disp: , Rfl:     Family History  Problem Relation Age of Onset   Hypertension Mother    Dementia Mother    Arthritis Mother    Hyperlipidemia Mother    Cancer Father        colon    Early death Father    Learning disabilities Brother    Colon polyps Brother    Hyperlipidemia Brother    Hypertension Brother    Heart attack Maternal Grandmother    Arthritis Maternal Grandmother    Heart disease Maternal Grandmother    Colon polyps Sister    Lupus Sister    Heart disease Maternal Grandfather    Heart disease Paternal Grandmother    Cancer Other      Social History   Tobacco Use   Smoking status: Every Day    Packs/day: 1.00    Types: Cigarettes   Smokeless tobacco: Never  Vaping Use   Vaping Use: Former  Substance Use Topics   Alcohol use: No    Comment: rare   Drug use: No    Allergies as of 06/15/2022 - Review Complete 06/15/2022  Allergen Reaction Noted    Chantix [varenicline tartrate]  03/15/2019   Atorvastatin Other (See Comments) 09/26/2019   Hydrochlorothiazide  10/15/2021   Penicillins Rash 09/16/2017   Wellbutrin [bupropion]  03/15/2019    Review of Systems:    All systems reviewed and negative except where noted in HPI.   Physical Exam:  BP (!) 172/90 (BP Location: Left Arm, Patient Position: Sitting, Cuff Size: Normal)   Pulse (!) 58   Temp 98.1 F (36.7 C) (Oral)   Ht '5\' 4"'$  (1.626 m)   Wt 144 lb 8 oz (65.5 kg)   BMI 24.80 kg/m  No LMP recorded. Patient has had a hysterectomy.  General:   Alert,  Well-developed, well-nourished, pleasant and cooperative in NAD Head:  Normocephalic and atraumatic. Eyes:  Sclera clear, no icterus.   Conjunctiva pink. Ears:  Normal auditory acuity. Nose:  No deformity, discharge, or lesions. Mouth:  No deformity or lesions,oropharynx pink & moist. Neck:  Supple; no masses or thyromegaly. Lungs:  Respirations even and unlabored.  Clear throughout to auscultation.   No wheezes, crackles, or rhonchi. No acute distress. Heart:  Regular rate and rhythm; no murmurs, clicks, rubs, or gallops. Abdomen:  Normal bowel sounds. Soft, non-tender and mildly distended, tympanic without masses, hepatosplenomegaly or hernias noted.  No guarding or rebound tenderness.   Rectal: Not performed Msk:  Symmetrical without gross deformities. Good, equal movement & strength bilaterally. Pulses:  Normal pulses noted. Extremities:  No clubbing or edema.  No cyanosis. Neurologic:  Alert and oriented x3;  grossly normal neurologically. Skin:  Intact without significant lesions or rashes. No jaundice. Psych:  Alert and cooperative. Normal mood and affect.  Imaging Studies: No abdominal imaging  Assessment and Plan:   Nic[facility] is a 66 y.o. female with chronic tobacco use, carotid artery stenosis, left carotid endarterectomy, right carotid artery stent placement on Plavix, chronic mild iron deficiency anemia,  COVID-positive on 08/27/2021 is seen in consultation for chronic intermittent nausea, postprandial left upper abdominal discomfort, abdominal bloating, intermittent nonbloody loose stools  Given history of heavy tobacco use, recommend to check pancreatic fecal elastase levels.  If this is unremarkable, recommend upper endoscopy for further evaluation.  If this is negative, recommend CT abdomen and pelvis with contrast Iron deficiency anemia has resolved, patient can discontinue fusion plus at this time  Follow up after above work-up   Nicole [facility]by, MD

## 2022-06-16 ENCOUNTER — Ambulatory Visit
Admission: RE | Admit: 2022-06-16 | Discharge: 2022-06-16 | Disposition: A | Discharge status: Home / Self Care | Payer: Medicare Other | Source: Ambulatory Visit | Attending: Internal Medicine | Admitting: Internal Medicine

## 2022-06-16 DIAGNOSIS — Z1231 Encounter for screening mammogram for malignant neoplasm of breast: Secondary | ICD-10-CM | POA: Diagnosis not present

## 2022-06-22 LAB — PANCREATIC ELASTASE, FECAL: Pancreatic Elastase, Fecal: 293 ug Elast./g (ref >200)

## 2022-06-23 ENCOUNTER — Telehealth: Payer: Self-pay

## 2022-06-23 ENCOUNTER — Other Ambulatory Visit: Payer: Medicare Other

## 2022-06-23 DIAGNOSIS — R110 Nausea: Secondary | ICD-10-CM

## 2022-06-23 DIAGNOSIS — R1010 Upper abdominal pain, unspecified: Secondary | ICD-10-CM

## 2022-06-23 NOTE — Telephone Encounter (Signed)
Patient verbalized understanding of results. She states she does not want to schedule EGD at this time will call us back if she changes her mind

## 2022-06-23 NOTE — Telephone Encounter (Signed)
-----   Message from Lin Landsman, MD sent at 06/23/2022  8:44 AM EDT ----- Pancreatic fecal elastase came back normal, recommend EGD as discussed if pt is agreeable Dx: chronic intermittent nausea, postprandial left upper abdominal discomfort, abdominal bloating, intermittent nonbloody loose stools  RV

## 2022-06-23 NOTE — Telephone Encounter (Signed)
Patient wants to do 07/22/2022. Will get blood thinner clearance for the Plavix.

## 2022-06-24 ENCOUNTER — Telehealth: Payer: Self-pay

## 2022-06-24 NOTE — Telephone Encounter (Signed)
We got back blood thinner clearance and patient needs to stop Plavix 7 days before procedure and restart it 2 days after procedure. Called and left a message for call back

## 2022-06-25 NOTE — Telephone Encounter (Signed)
Patient called back and informed patient and she verbalized understanding of instructions

## 2022-06-25 NOTE — Telephone Encounter (Signed)
Called and left a message for call back  

## 2022-07-13 ENCOUNTER — Encounter (INDEPENDENT_AMBULATORY_CARE_PROVIDER_SITE_OTHER): Payer: Self-pay

## 2022-07-20 ENCOUNTER — Telehealth: Payer: Self-pay

## 2022-07-20 NOTE — Telephone Encounter (Signed)
Patient left a voicemail to see if she could take her blood pressure medication and thyroid medication the morning of her procedure. Informed patient she could take both medications with a tiny sip of water

## 2022-07-22 ENCOUNTER — Encounter
Admission: RE | Disposition: A | Discharge status: Home / Self Care | Payer: Self-pay | Source: Home / Self Care | Attending: Gastroenterology

## 2022-07-22 ENCOUNTER — Ambulatory Visit: Payer: Medicare Other

## 2022-07-22 ENCOUNTER — Encounter: Payer: Self-pay | Admitting: Gastroenterology

## 2022-07-22 ENCOUNTER — Ambulatory Visit
Admission: RE | Admit: 2022-07-22 | Discharge: 2022-07-22 | Disposition: A | Discharge status: Home / Self Care | Payer: Medicare Other | Attending: Gastroenterology | Admitting: Gastroenterology

## 2022-07-22 DIAGNOSIS — R1013 Epigastric pain: Secondary | ICD-10-CM | POA: Insufficient documentation

## 2022-07-22 DIAGNOSIS — M797 Fibromyalgia: Secondary | ICD-10-CM | POA: Diagnosis not present

## 2022-07-22 DIAGNOSIS — F17200 Nicotine dependence, unspecified, uncomplicated: Secondary | ICD-10-CM | POA: Insufficient documentation

## 2022-07-22 DIAGNOSIS — R110 Nausea: Secondary | ICD-10-CM

## 2022-07-22 DIAGNOSIS — R140 Abdominal distension (gaseous): Secondary | ICD-10-CM | POA: Diagnosis present

## 2022-07-22 DIAGNOSIS — I2510 Atherosclerotic heart disease of native coronary artery without angina pectoris: Secondary | ICD-10-CM | POA: Insufficient documentation

## 2022-07-22 DIAGNOSIS — K219 Gastro-esophageal reflux disease without esophagitis: Secondary | ICD-10-CM | POA: Insufficient documentation

## 2022-07-22 DIAGNOSIS — E039 Hypothyroidism, unspecified: Secondary | ICD-10-CM | POA: Insufficient documentation

## 2022-07-22 DIAGNOSIS — K449 Diaphragmatic hernia without obstruction or gangrene: Secondary | ICD-10-CM | POA: Diagnosis not present

## 2022-07-22 DIAGNOSIS — F419 Anxiety disorder, unspecified: Secondary | ICD-10-CM | POA: Diagnosis not present

## 2022-07-22 DIAGNOSIS — I10 Essential (primary) hypertension: Secondary | ICD-10-CM | POA: Insufficient documentation

## 2022-07-22 DIAGNOSIS — R197 Diarrhea, unspecified: Secondary | ICD-10-CM | POA: Insufficient documentation

## 2022-07-22 DIAGNOSIS — R1010 Upper abdominal pain, unspecified: Secondary | ICD-10-CM | POA: Diagnosis not present

## 2022-07-22 HISTORY — PX: ESOPHAGOGASTRODUODENOSCOPY (EGD) WITH PROPOFOL: SHX5813

## 2022-07-22 SURGERY — ESOPHAGOGASTRODUODENOSCOPY (EGD) WITH PROPOFOL
Anesthesia: General

## 2022-07-22 MED ORDER — LIDOCAINE HCL (CARDIAC) PF 100 MG/5ML IV SOSY
PREFILLED_SYRINGE | INTRAVENOUS | Status: DC | PRN
  Administered 2022-07-22 (×2): 50 mg via INTRAVENOUS

## 2022-07-22 MED ORDER — PROPOFOL 10 MG/ML IV BOLUS
INTRAVENOUS | Status: DC | PRN
  Administered 2022-07-22: 70 mg via INTRAVENOUS

## 2022-07-22 MED ORDER — SODIUM CHLORIDE 0.9 % IV SOLN: INTRAVENOUS | Status: DC

## 2022-07-22 MED ORDER — PROPOFOL 500 MG/50ML IV EMUL
INTRAVENOUS | Status: DC | PRN
  Administered 2022-07-22: 140 ug/kg/min via INTRAVENOUS

## 2022-07-22 NOTE — Anesthesia Procedure Notes (Addendum)
Procedure Name: MAC Date/Time: 07/22/2022 10:23 AM  Performed by: Tollie Eth, CRNAPre-anesthesia Checklist: Patient identified, Emergency Drugs available, Suction available and Patient being monitored Patient Re-evaluated:Patient Re-evaluated prior to induction Oxygen Delivery Method: Nasal cannula Induction Type: IV induction Placement Confirmation: positive ETCO2

## 2022-07-22 NOTE — Transfer of Care (Signed)
Immediate Anesthesia Transfer of Care Note  Patient: [facility]  Procedure(s) Performed: ESOPHAGOGASTRODUODENOSCOPY (EGD) WITH PROPOFOL  Patient Location: PACU and Endoscopy Unit  Anesthesia Type:General  Level of Consciousness: drowsy  Airway & Oxygen Therapy: Patient Spontanous Breathing  Post-op Assessment: Report given to RN  Post vital signs: Reviewed  Last Vitals:  Vitals Value Taken Time  BP 136/119 07/22/22 1043  Temp 36.1 C 07/22/22 1043  Pulse 57 07/22/22 1044  Resp 17 07/22/22 1044  SpO2 96 % 07/22/22 1044  Vitals shown include unvalidated device data.  Last Pain:  Vitals:   07/22/22 1043  TempSrc: Tympanic  PainSc: 0-No pain         Complications: No notable events documented.

## 2022-07-22 NOTE — Anesthesia Preprocedure Evaluation (Signed)
Anesthesia Evaluation  Patient identified by MRN, date of birth, ID band Patient awake    Reviewed: Allergy & Precautions, NPO status , Patient's Chart, lab work & pertinent test results  History of Anesthesia Complications (+) PONV, Family history of anesthesia reaction and history of anesthetic complications  Airway Mallampati: III       Dental  (+) Dental Advidsory Given, Poor Dentition   Pulmonary shortness of breath and with exertion, COPD, neg recent URI, Current Smoker and Patient abstained from smoking.          Cardiovascular hypertension, (-) angina + CAD and + Peripheral Vascular Disease  (-) Past MI and (-) Cardiac Stents + dysrhythmias + Valvular Problems/Murmurs      Neuro/Psych neg Seizures PSYCHIATRIC DISORDERS Anxiety      Neuromuscular disease (fibromyalgia)    GI/Hepatic Neg liver ROS,GERD  ,,  Endo/Other  neg diabetesHypothyroidism    Renal/GU negative Renal ROS  negative genitourinary   Musculoskeletal   Abdominal   Peds negative pediatric ROS (+)  Hematology  (+) Blood dyscrasia, anemia   Anesthesia Other Findings Past Medical History: 2017: Airway polyps No date: Anemia No date: Anxiety 07/2019: Carotid artery stenosis, unilateral, right No date: Colon polyps No date: Complication of anesthesia No date: Coronary artery disease     Comment:  s/p carotid surgery left  No date: Does use hearing aid No date: Dyspnea     Comment:  d/t vocal cord polyps No date: Dysrhythmia     Comment:  bradycardia when in hospital getting stent placed No date: Fibromyalgia No date: GERD (gastroesophageal reflux disease) No date: Granuloma annulare No date: Heart murmur     Comment:  born with it, no treatment No date: History of chicken pox No date: Hypertension No date: PONV (postoperative nausea and vomiting) No date: Thyroid disease  Reproductive/Obstetrics negative OB ROS                              Anesthesia Physical Anesthesia Plan  ASA: 3  Anesthesia Plan: General   Post-op Pain Management:    Induction: Intravenous  PONV Risk Score and Plan: 3 and Propofol infusion and TIVA  Airway Management Planned: Nasal Cannula and Natural Airway  Additional Equipment:   Intra-op Plan:   Post-operative Plan:   Informed Consent: I have reviewed the patients History and Physical, chart, labs and discussed the procedure including the risks, benefits and alternatives for the proposed anesthesia with the patient or authorized representative who has indicated his/her understanding and acceptance.     Dental advisory given  Plan Discussed with: CRNA and Surgeon  Anesthesia Plan Comments:         Anesthesia Quick Evaluation

## 2022-07-22 NOTE — H&P (Signed)
[name], MD [address]  [place]  [place], [place] [address]  Main: [phone]  Fax: [phone] Pager: [phone]  Primary Care Physician:  Idelle Crouch, MD Primary Gastroenterologist:  Dr. [name]  Pre-Procedure History & Physical: HPI:  [facility] is a 66 y.o. female is here for an endoscopy.   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   Family history of adverse reaction to anesthesia    sister - PONV   Fibromyalgia    GERD (gastroesophageal reflux disease)    Granuloma annulare    Heart murmur    born with it, no treatment   History of chicken pox    Hypertension    Mitral valve regurgitation    PONV (postoperative nausea and vomiting)    Thyroid disease     Past Surgical History:  Procedure Laterality Date   ABDOMINAL HYSTERECTOMY     1985   CAROTID ENDARTERECTOMY     left    CAROTID PTA/STENT INTERVENTION Right 03/22/2019   Procedure: CAROTID PTA/STENT INTERVENTION;  Surgeon: Katha Cabal, MD;  Location: [facility];  Service: Cardiovascular;  Laterality: Right;   CARPAL TUNNEL RELEASE     x2   CATARACT EXTRACTION W/PHACO Left 10/29/2021   Procedure: CATARACT EXTRACTION PHACO AND INTRAOCULAR LENS PLACEMENT ([place]) LEFT;  Surgeon: Leandrew Koyanagi, MD;  Location: [facility];  Service: Ophthalmology;  Laterality: Left;  2.25 00:18.7   CATARACT EXTRACTION W/PHACO Right 11/12/2021   Procedure: CATARACT EXTRACTION PHACO AND INTRAOCULAR LENS PLACEMENT ([place])  RIGHT;  Surgeon: Leandrew Koyanagi, MD;   Location: [facility];  Service: Ophthalmology;  Laterality: Right;  CAN'T ARRIVE UNTIL 1 6.26 00:50.2   CESAREAN SECTION     1982   COLONOSCOPY WITH PROPOFOL N/A 03/06/2020   Procedure: COLONOSCOPY WITH PROPOFOL;  Surgeon: Lin Landsman, MD;  Location: [facility] ENDOSCOPY;  Service: Gastroenterology;  Laterality: N/A;   ESOPHAGOGASTRODUODENOSCOPY (EGD) WITH PROPOFOL N/A 03/06/2020   Procedure: ESOPHAGOGASTRODUODENOSCOPY (EGD) WITH PROPOFOL;  Surgeon: Lin Landsman, MD;  Location: [facility]COPY;  Service: Gastroenterology;  Laterality: N/A;   OOPHORECTOMY     1/2 ovaries removed    STAPEDES SURGERY     otosclerosis right .  prostetic in right.   TRIGGER FINGER RELEASE      Prior to Admission medications   Medication Sig Start Date End Date Taking? Authorizing Provider  amLODipine (NORVASC) 5 MG tablet Take 5 mg by mouth daily. 06/24/22 06/24/23  [provider]  aspirin 81 MG chewable tablet Chew 81 mg by mouth daily.     [provider]  atorvastatin (LIPITOR) 10 MG tablet Take 10 mg by mouth daily. 09/22/21   [provider]  clopidogrel (PLAVIX) 75 MG tablet Take 1 tablet (75 mg total) by mouth daily. 03/25/19   Stegmayer, Joelene Millin A, PA-C  gabapentin (NEURONTIN) 100 MG capsule Take 1 capsule by mouth at bedtime. 05/19/22 05/19/23  [provider]  Iron-FA-B Cmp-C-Biot-Probiotic (FUSION PLUS) CAPS TAKE 1 CAPSULE BY MOUTH ONCE DAILY 03/02/22   Lin Landsman, MD  levothyroxine (SYNTHROID) 75 MCG tablet Take by mouth. 07/15/21   [provider]  ondansetron (ZOFRAN) 4 MG tablet Take 4 mg by mouth every 8 (eight) hours as needed. 09/17/21   [provider]  pantoprazole (PROTONIX) 40 MG tablet Take 1 tablet (40 mg total) by mouth 2 (two) times daily before a meal. 30 minutes before meal 04/27/19   McLean-Scocuzza, Nino Glow, MD  telmisartan (MICARDIS) 80 MG tablet Take 80 mg by mouth daily.    [provider]     Allergies as of 06/24/2022 - Review Complete 06/15/2022  Allergen Reaction Noted   Chantix [varenicline tartrate]  03/15/2019   Atorvastatin Other (See Comments) 09/26/2019   Hydrochlorothiazide  10/15/2021   Penicillins Rash 09/16/2017   Wellbutrin [bupropion]  03/15/2019    Family History  Problem Relation Age of Onset   Hypertension Mother    Dementia Mother    Arthritis Mother    Hyperlipidemia Mother    Cancer Father        colon    Early death Father    Learning disabilities Brother    Colon polyps Brother    Hyperlipidemia Brother    Hypertension Brother    Heart attack Maternal Grandmother    Arthritis Maternal Grandmother    Heart disease Maternal Grandmother    Colon polyps Sister    Lupus Sister    Heart disease Maternal Grandfather    Heart disease Paternal Grandmother    Cancer Other     Social History   Socioeconomic History   Marital status: Single    Spouse name: Not on file   Number of children: Not on file   Years of education: Not on file   Highest education level: Not on file  Occupational History   Occupation: was a Quarry manager    Comment: retired  Tobacco Use   Smoking status: Every Day    Packs/day: 1.00    Types: Cigarettes   Smokeless tobacco: Never  Vaping Use   Vaping Use: Former  Substance and Sexual Activity   Alcohol use: No    Comment: rare   Drug use: No   Sexual activity: Yes  Other Topics Concern   Not on file  Social History Narrative   1 son and 1 grandchild in [place] in [place]    1 fiance of 14 years as of 03/15/2019 (with h/o alcoholism, cardiac defibrillator, liver failure)    Was CNA   Social Determinants of Health   Financial Resource Strain: Not on file  Food Insecurity: Not on file  Transportation Needs: Not on file  Physical Activity: Not on file  Stress: Not on file  Social Connections: Not on file  Intimate Partner Violence: Not on file    Review of Systems: See HPI, otherwise negative ROS  Physical  Exam: BP (!) 152/75   Pulse 62   Temp 98.1 F (36.7 C) (Temporal)   Resp 20   Ht '5\' 4"'$  (1.626 m)   Wt 63 kg   SpO2 100%   BMI 23.86 kg/m  General:   Alert,  pleasant and cooperative in NAD Head:  Normocephalic and atraumatic. Neck:  Supple; no masses or thyromegaly. Lungs:  Clear throughout to auscultation.    Heart:  Regular rate and rhythm. Abdomen:  Soft, nontender and nondistended. Normal bowel sounds, without guarding, and without  rebound.   Neurologic:  Alert and  oriented x4;  grossly normal neurologically.  Impression/Plan: ZEOLA BRYS is here for an endoscopy to be performed for chronic intermittent nausea, postprandial left upper abdominal discomfort, abdominal bloating, intermittent nonbloody loose stools   Risks, benefits, limitations, and alternatives regarding  endoscopy have been reviewed with the patient.  Questions have been answered.  All parties agreeable.   Sherri Sear, MD  07/22/2022, 10:15 AM

## 2022-07-22 NOTE — Op Note (Signed)
[facility]astroenterology Patient Name: Nicole Herman Procedure Date: 07/22/2022 10:18 AM MRN: [id number] Account #: [id number] Date of Birth: 1956/07/25 Admit Type: Outpatient Age: 66 Room: [facility] ENDO ROOM 4 Gender: Female Note Status: Finalized Instrument Name: Altamese Cabal Endoscope [id number] Procedure:             Upper GI endoscopy Indications:           Dyspepsia, Abdominal bloating, Diarrhea Providers:             Lin Landsman MD, MD Referring MD:          Leonie Douglas. Doy Hutching, MD (Referring MD) Medicines:             General Anesthesia Complications:         No immediate complications. Estimated blood loss: None. Procedure:             Pre-Anesthesia Assessment:                        - Prior to the procedure, a History and Physical was                         performed, and patient medications and allergies were                         reviewed. The patient is competent. The risks and                         benefits of the procedure and the sedation options and                         risks were discussed with the patient. All questions                         were answered and informed consent was obtained.                         Patient identification and proposed procedure were                         verified by the physician, the nurse, the                         anesthesiologist, the anesthetist and the technician                         in the pre-procedure area in the procedure room in the                         endoscopy suite. Mental Status Examination: alert and                         oriented. Airway Examination: normal oropharyngeal                         airway and neck mobility. Respiratory Examination:                         clear to auscultation. CV Examination: normal.  Prophylactic Antibiotics: The patient does not require                         prophylactic antibiotics. Prior Anticoagulants: The                          patient has taken Plavix (clopidogrel), last dose was                         5 days prior to procedure. ASA Grade Assessment: III -                         A patient with severe systemic disease. After                         reviewing the risks and benefits, the patient was                         deemed in satisfactory condition to undergo the                         procedure. The anesthesia plan was to use general                         anesthesia. Immediately prior to administration of                         medications, the patient was re-assessed for adequacy                         to receive sedatives. The heart rate, respiratory                         rate, oxygen saturations, blood pressure, adequacy of                         pulmonary ventilation, and response to care were                         monitored throughout the procedure. The physical                         status of the patient was re-assessed after the                         procedure.                        After obtaining informed consent, the endoscope was                         passed under direct vision. Throughout the procedure,                         the patient's blood pressure, pulse, and oxygen                         saturations were monitored continuously. The Endoscope  was introduced through the mouth, and advanced to the                         second part of duodenum. The upper GI endoscopy was                         accomplished without difficulty. The patient tolerated                         the procedure fairly well. Findings:      The duodenal bulb and second portion of the duodenum were normal.       Biopsies were taken with a cold forceps for histology.      The entire examined stomach was normal. Biopsies were taken with a cold       forceps for histology.      The cardia and gastric fundus were normal on retroflexion.      A small hiatal hernia was  present.      Esophagogastric landmarks were identified: the gastroesophageal junction       was found at 35 cm from the incisors.      The gastroesophageal junction and examined esophagus were normal. Impression:            - Normal duodenal bulb and second portion of the                         duodenum. Biopsied.                        - Normal stomach. Biopsied.                        - Small hiatal hernia.                        - Esophagogastric landmarks identified.                        - Normal gastroesophageal junction and esophagus. Recommendation:        - Await pathology results.                        - Discharge patient to home (with escort).                        - Resume previous diet today.                        - Continue present medications. Procedure Code(s):     --- Professional ---                        [phone], Esophagogastroduodenoscopy, flexible,                         transoral; with biopsy, single or multiple Diagnosis Code(s):     --- Professional ---                        K44.9, Diaphragmatic hernia without obstruction or  gangrene                        R10.13, Epigastric pain                        R14.0, Abdominal distension (gaseous)                        R19.7, Diarrhea, unspecified CPT copyright 2022 [employer]. All rights reserved. The codes documented in this report are preliminary and upon coder review may  be revised to meet current compliance requirements. Dr. Ulyess Mort Lin Landsman MD, MD 07/22/2022 10:41:16 AM This report has been signed electronically. Number of Addenda: 0 Note Initiated On: 07/22/2022 10:18 AM Estimated Blood Loss:  Estimated blood loss: none.      [facility]

## 2022-07-23 ENCOUNTER — Encounter: Payer: Self-pay | Admitting: Gastroenterology

## 2022-07-23 ENCOUNTER — Telehealth: Payer: Self-pay

## 2022-07-23 DIAGNOSIS — R140 Abdominal distension (gaseous): Secondary | ICD-10-CM

## 2022-07-23 DIAGNOSIS — Z862 Personal history of diseases of the blood and blood-forming organs and certain disorders involving the immune mechanism: Secondary | ICD-10-CM

## 2022-07-23 DIAGNOSIS — R1010 Upper abdominal pain, unspecified: Secondary | ICD-10-CM

## 2022-07-23 DIAGNOSIS — R110 Nausea: Secondary | ICD-10-CM

## 2022-07-23 LAB — SURGICAL PATHOLOGY

## 2022-07-23 NOTE — Telephone Encounter (Signed)
-----   Message from Lin Landsman, MD sent at 07/23/2022  2:20 PM EST ----- Please inform patient that the pathology results from upper endoscopy came back normal.  Recommend to check food allergy profile and alpha gal panel if patient is agreeable as well as GI profile PCR  RV

## 2022-07-23 NOTE — Telephone Encounter (Signed)
Called and left a message for call back  

## 2022-07-23 NOTE — Telephone Encounter (Signed)
Patient verablized understanding of results. She is okay with getting labs. She states she will come Tuesday for labs

## 2022-07-31 LAB — FOOD ALLERGY PROFILE
Allergen Corn, IgE: <0.1 kU/L
Clam IgE: <0.1 kU/L
Codfish IgE: <0.1 kU/L
Egg White IgE: <0.1 kU/L
Milk IgE: <0.1 kU/L
Peanut IgE: <0.1 kU/L
Scallop IgE: <0.1 kU/L
Sesame Seed IgE: <0.1 kU/L
Shrimp IgE: <0.1 kU/L
Soybean IgE: <0.1 kU/L
Walnut IgE: <0.1 kU/L
Wheat IgE: <0.1 kU/L

## 2022-07-31 LAB — ALPHA-GAL PANEL
Allergen Lamb IgE: <0.1 kU/L
Beef IgE: <0.1 kU/L
IgE (Immunoglobulin E), Serum: 11 IU/mL (ref 6–495)
O215-IgE Alpha-Gal: <0.1 kU/L
Pork IgE: <0.1 kU/L

## 2022-08-02 LAB — GI PROFILE, STOOL, PCR

## 2022-08-03 ENCOUNTER — Telehealth: Payer: Self-pay

## 2022-08-03 MED ORDER — DIFICID 200 MG PO TABS: 200.0000 mg | ORAL_TABLET | Freq: Two times a day (BID) | ORAL | 0 refills | Status: DC

## 2022-08-03 NOTE — Telephone Encounter (Signed)
Patient verbalized understanding of results. She asked why it can not be sent to [place]. Informed her that this pharmacy keeps it in stock. She verbalized understanding and will get this medication

## 2022-08-03 NOTE — Telephone Encounter (Signed)
-----   Message from Lin Landsman, MD sent at 08/03/2022 12:51 PM EST ----- Stool studies came back positive for C. difficile.  Recommend Dificid 200 mg twice daily for 10 days  Please send prescription to [place]

## 2022-08-03 NOTE — Anesthesia Postprocedure Evaluation (Addendum)
Anesthesia Post Note  Patient: Nicole Herman  Procedure(s) Performed: ESOPHAGOGASTRODUODENOSCOPY (EGD) WITH PROPOFOL  Patient location during evaluation: Endoscopy Anesthesia Type: General Level of consciousness: awake and alert Pain management: pain level controlled Vital Signs Assessment: post-procedure vital signs reviewed and stable Respiratory status: spontaneous breathing, nonlabored ventilation, respiratory function stable and patient connected to nasal cannula oxygen Cardiovascular status: blood pressure returned to baseline and stable Postop Assessment: no apparent nausea or vomiting Anesthetic complications: no   No notable events documented.   Last Vitals:  Vitals:   07/22/22 1043 07/22/22 1050  BP: 117/67 132/86  Pulse: (!) 59 (!) 58  Resp: 18 17  Temp: (!) 36.1 C   SpO2: 96% 98%    Last Pain:  Vitals:   07/22/22 1050  TempSrc:   PainSc: 0-No pain                 Martha Clan

## 2022-08-03 NOTE — Addendum Note (Signed)
Addendum  created 08/03/22 0820 by Martha Clan, MD   Clinical Note Signed

## 2022-08-04 ENCOUNTER — Telehealth: Payer: Self-pay

## 2022-08-04 ENCOUNTER — Other Ambulatory Visit: Payer: Self-pay

## 2022-08-04 MED ORDER — DIFICID 200 MG PO TABS: 200.0000 mg | ORAL_TABLET | Freq: Two times a day (BID) | ORAL | 0 refills | Status: AC

## 2022-08-04 NOTE — Telephone Encounter (Signed)
Patient called and she wants the dificid sent to [facility] because she does not want to go to [facility]. For some reason it was sent to [facility]. Called [facility] and canceled the medication there Informed patient that this has been sent to the requesting pharmacy

## 2023-01-07 ENCOUNTER — Other Ambulatory Visit (INDEPENDENT_AMBULATORY_CARE_PROVIDER_SITE_OTHER): Payer: Self-pay

## 2023-01-07 ENCOUNTER — Telehealth (INDEPENDENT_AMBULATORY_CARE_PROVIDER_SITE_OTHER): Payer: Self-pay

## 2023-01-07 MED ORDER — CLOPIDOGREL BISULFATE 75 MG PO TABS: 75.0000 mg | ORAL_TABLET | Freq: Every day | ORAL | 0 refills | Status: AC

## 2023-01-07 NOTE — Telephone Encounter (Signed)
Patient called for a refill of Plavix. She has currently moved to [place] and can't get into a dr until July. She is in need of Plavix until then. It was approved by [facility] [place] to fill a 90 supply with no refills to hold her until her July appt.  She also needs he appt canceled.

## 2023-04-26 ENCOUNTER — Encounter (INDEPENDENT_AMBULATORY_CARE_PROVIDER_SITE_OTHER): Payer: Medicare Other

## 2023-04-26 ENCOUNTER — Ambulatory Visit (INDEPENDENT_AMBULATORY_CARE_PROVIDER_SITE_OTHER): Payer: No Typology Code available for payment source | Admitting: Vascular Surgery

## 2023-05-25 ENCOUNTER — Telehealth (INDEPENDENT_AMBULATORY_CARE_PROVIDER_SITE_OTHER): Payer: Self-pay

## 2023-05-25 NOTE — Telephone Encounter (Signed)
Pt called stating that she needs orders to hold plavix for an injection in her back with [facility] in [place].  I called pt back and let her know that we are no longer prescribing her Plavix so we can not write orders for it to be held.  She states understanding and she will let them know.

## 2023-05-27 IMAGING — MG MM DIGITAL SCREENING BILAT W/ TOMO AND CAD
6 of 10 series · 6 of 30 positions shown · non-contrast
Comparison: Previous exam(s).

CLINICAL DATA: Screening.

EXAM:
DIGITAL SCREENING BILATERAL MAMMOGRAM WITH TOMOSYNTHESIS AND CAD
TECHNIQUE: Bilateral screening digital craniocaudal and mediolateral oblique
mammograms were obtained. Bilateral screening digital breast
tomosynthesis was performed. The images were evaluated with
computer-aided detection.

[L CC synth-2D]
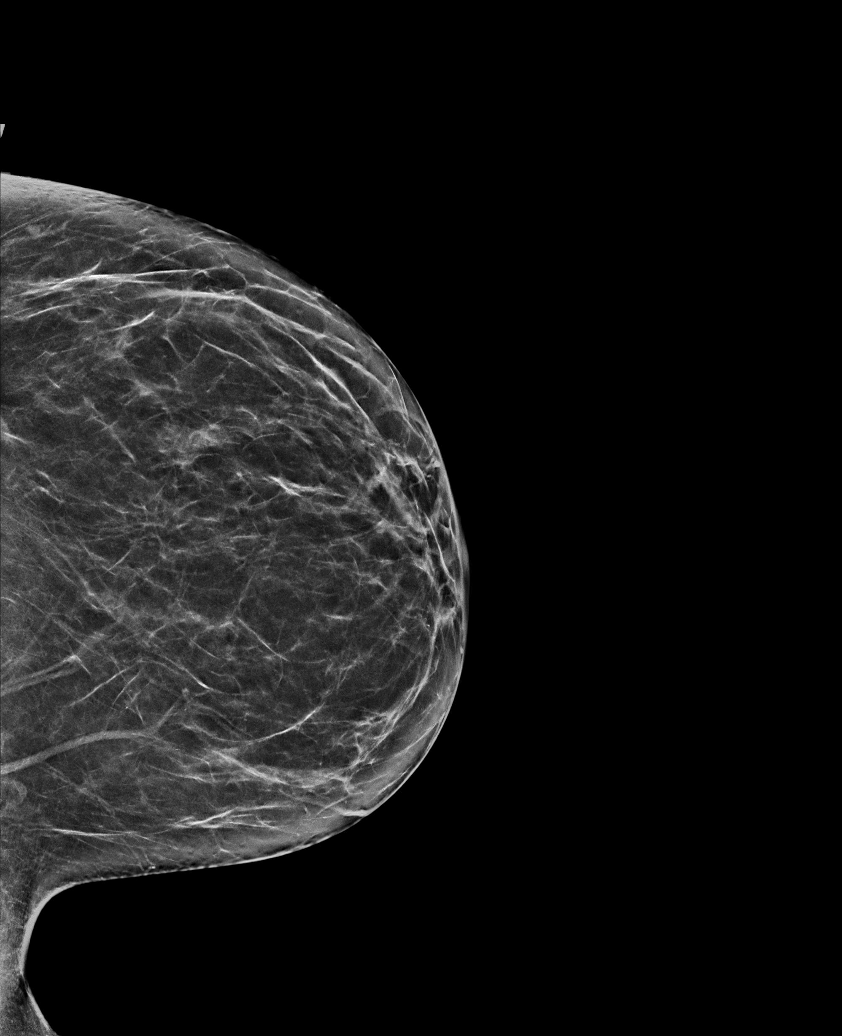

[R CC synth-2D]
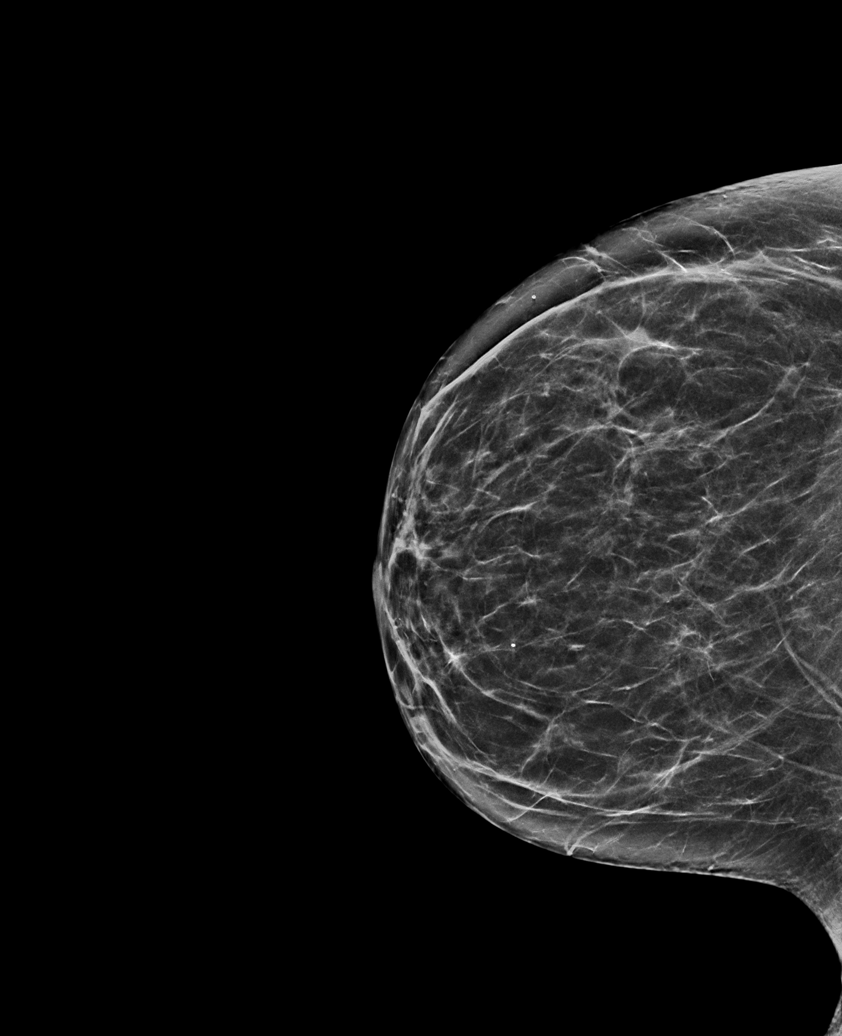

[L XCCL synth-2D]
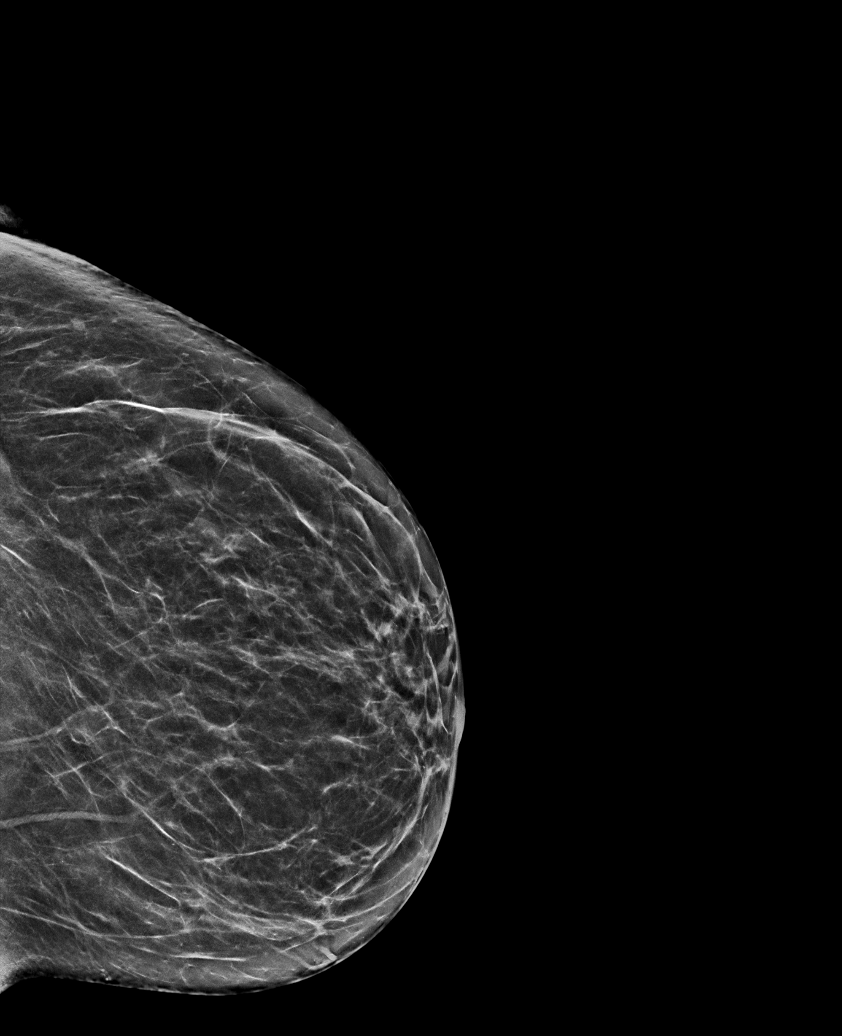

[L MLO synth-2D]
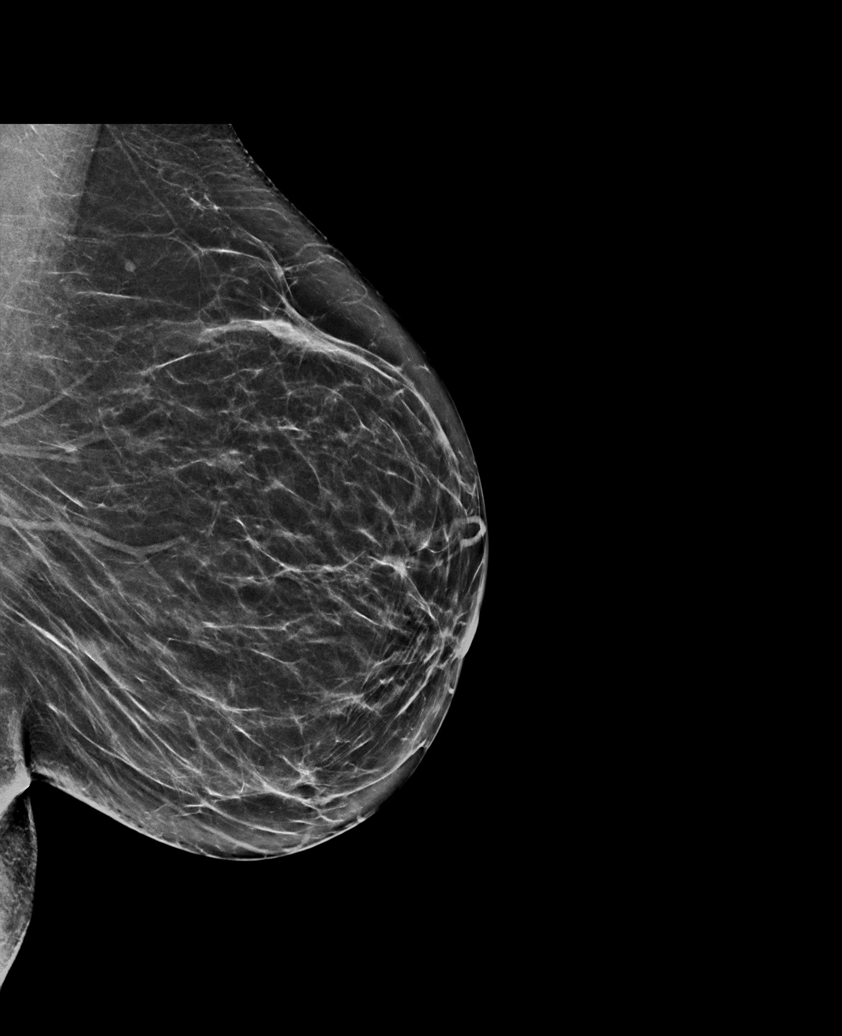

[R MLO synth-2D]
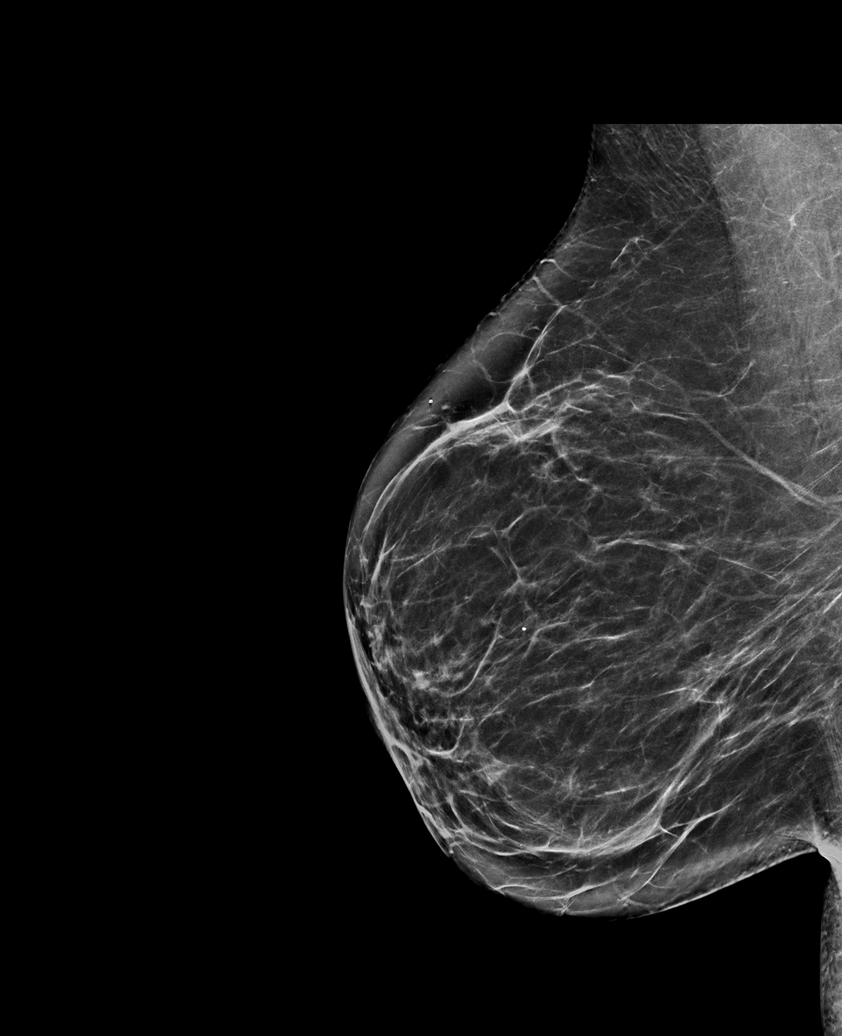

[L MLO tomo · tomo slice 33/65.0]
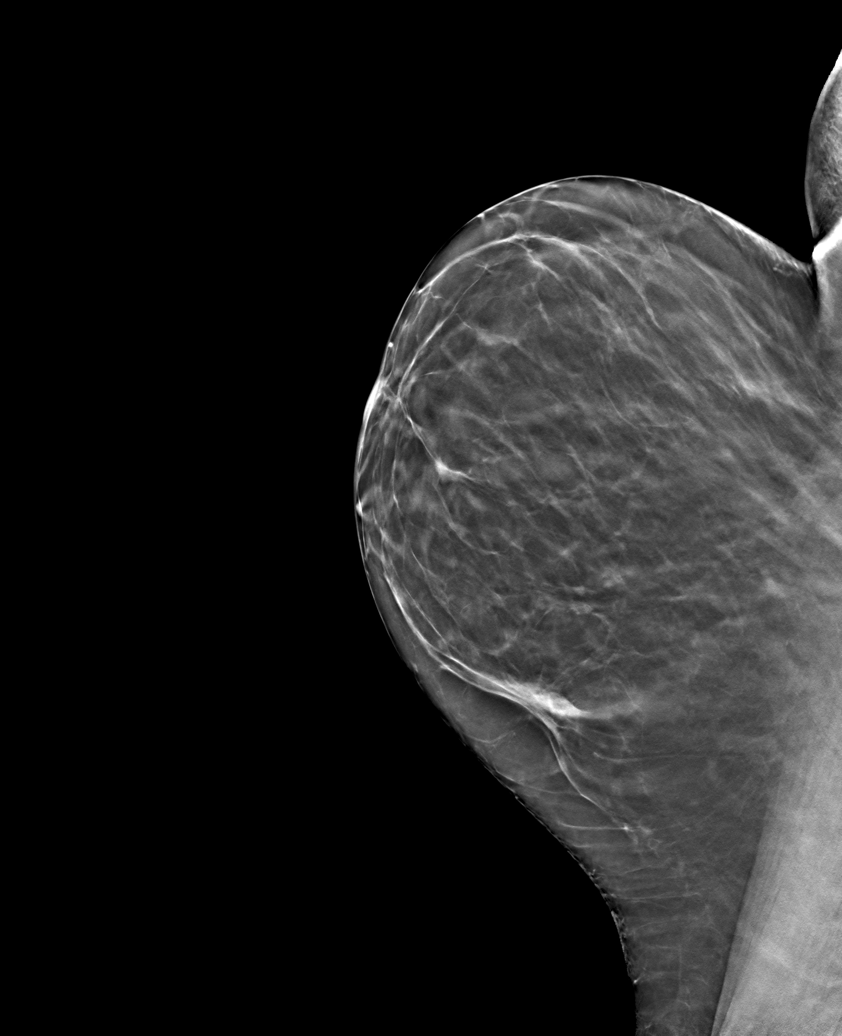

[6 of 30 positions shown; findings below may reference images not displayed]

ACR Breast Density Category b: There are scattered areas of
fibroglandular density.
FINDINGS: There are no findings suspicious for malignancy.
IMPRESSION: No mammographic evidence of malignancy. A result letter of this
screening mammogram will be mailed directly to the patient.

RECOMMENDATION:
Screening mammogram in one year. (Code:51-O-LD2)

BI-RADS CATEGORY  1: Negative.

## 2024-02-01 ENCOUNTER — Encounter (INDEPENDENT_AMBULATORY_CARE_PROVIDER_SITE_OTHER): Payer: Self-pay
